# Patient Record
Sex: Female | Born: 1990 | Hispanic: Yes | Marital: Married | Smoking: Never smoker
Health system: Southern US, Community
[De-identification: ages and names within clinical notes are randomized; demographics above are authoritative.]

## PROBLEM LIST (undated history)

## (undated) DIAGNOSIS — Z862 Personal history of diseases of the blood and blood-forming organs and certain disorders involving the immune mechanism: Secondary | ICD-10-CM

## (undated) DIAGNOSIS — Z9889 Other specified postprocedural states: Secondary | ICD-10-CM

## (undated) DIAGNOSIS — I10 Essential (primary) hypertension: Secondary | ICD-10-CM

## (undated) DIAGNOSIS — O139 Gestational [pregnancy-induced] hypertension without significant proteinuria, unspecified trimester: Secondary | ICD-10-CM

## (undated) DIAGNOSIS — D649 Anemia, unspecified: Secondary | ICD-10-CM

## (undated) DIAGNOSIS — Z8759 Personal history of other complications of pregnancy, childbirth and the puerperium: Secondary | ICD-10-CM

## (undated) DIAGNOSIS — F32A Depression, unspecified: Secondary | ICD-10-CM

## (undated) HISTORY — DX: Anemia, unspecified: D64.9

## (undated) HISTORY — DX: Other specified postprocedural states: Z98.890

## (undated) HISTORY — DX: Personal history of other complications of pregnancy, childbirth and the puerperium: Z87.59

## (undated) HISTORY — DX: Personal history of diseases of the blood and blood-forming organs and certain disorders involving the immune mechanism: Z86.2

## (undated) HISTORY — PX: OTHER SURGICAL HISTORY: SHX169

---
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
## 2012-09-02 ENCOUNTER — Ambulatory Visit: Payer: Self-pay | Admitting: Physician Assistant

## 2012-09-02 VITALS — BP 128/77 | HR 66 | Temp 99.3°F | Resp 16 | Ht 67.5 in | Wt 168.2 lb

## 2012-09-02 DIAGNOSIS — R51 Headache: Secondary | ICD-10-CM

## 2012-09-02 DIAGNOSIS — R8271 Bacteriuria: Secondary | ICD-10-CM

## 2012-09-02 DIAGNOSIS — R300 Dysuria: Secondary | ICD-10-CM

## 2012-09-02 DIAGNOSIS — IMO0002 Reserved for concepts with insufficient information to code with codable children: Secondary | ICD-10-CM

## 2012-09-02 DIAGNOSIS — R519 Headache, unspecified: Secondary | ICD-10-CM

## 2012-09-02 DIAGNOSIS — R82998 Other abnormal findings in urine: Secondary | ICD-10-CM

## 2012-09-02 DIAGNOSIS — R5381 Other malaise: Secondary | ICD-10-CM

## 2012-09-02 DIAGNOSIS — J309 Allergic rhinitis, unspecified: Secondary | ICD-10-CM

## 2012-09-02 DIAGNOSIS — R5383 Other fatigue: Secondary | ICD-10-CM

## 2012-09-02 LAB — POCT WET PREP WITH KOH
RBC Wet Prep HPF POC: NEGATIVE
WBC Wet Prep HPF POC: NEGATIVE

## 2012-09-02 LAB — POCT URINALYSIS DIPSTICK
Bilirubin, UA: NEGATIVE
Blood, UA: NEGATIVE
Glucose, UA: NEGATIVE
Ketones, UA: NEGATIVE
Nitrite, UA: NEGATIVE
Protein, UA: NEGATIVE
Spec Grav, UA: 1.02
Urobilinogen, UA: 0.2
pH, UA: 5.5

## 2012-09-02 LAB — POCT CBC
Hemoglobin: 14.4 g/dL (ref 12.2–16.2)
Lymph, poc: 1.8 (ref 0.6–3.4)
MCH, POC: 31.3 pg — AB (ref 27–31.2)
MCHC: 32.2 g/dL (ref 31.8–35.4)
MCV: 97.2 fL — AB (ref 80–97)
MID (cbc): 0.3 (ref 0–0.9)
MPV: 9.6 fL (ref 0–99.8)
POC MID %: 3.2 %M (ref 0–12)
Platelet Count, POC: 271 10*3/uL (ref 142–424)
WBC: 8.8 10*3/uL (ref 4.6–10.2)

## 2012-09-02 LAB — POCT UA - MICROSCOPIC ONLY
Casts, Ur, LPF, POC: NEGATIVE
Crystals, Ur, HPF, POC: NEGATIVE
Mucus, UA: POSITIVE
Yeast, UA: NEGATIVE

## 2012-09-02 MED ORDER — SULFAMETHOXAZOLE-TRIMETHOPRIM 800-160 MG PO TABS: 1.0000 | ORAL_TABLET | Freq: Two times a day (BID) | ORAL | Status: DC

## 2012-09-02 NOTE — Patient Instructions (Addendum)
Get plenty of rest and drink at least 64 ounces of water daily.  Use condoms EVERY SINGLE TIME. Sex should never hurt.  Ever.  No matter what.  If it does, stop and talk to your partner. Make sure you're ready and turned on before your partner penetrates your vagina.  Using a finger, or going more slowly can help.  Try using an over-the-counter allergy medication daily to reduce your allergy symptoms.  Claritin, Zyrtec and Allegra are excellent choices. If your symptoms are not better in 2-3 weeks, please return for additional evaluation.  Please make an appointment at the [facility] Department  [phone]) for a pap test, andSTI testing, as well as a flu vaccine.  They will cost less there than they do here.

## 2012-09-02 NOTE — Progress Notes (Signed)
Subjective:    Patient ID: Madison Hood, female    DOB: 05/02/1991, 22 y.o.   MRN: [id number]  HPI  This 21 y.o. female presents for evaluation of urinary burning, frequency and urgency x 1 week.  Symptoms began with an episode of vaginal pain during sex.  Had to stop intercourse because of the severity of pain.  Had no bleeding.  Pain resolved after a few minutes.  No history of painful sex previously, but on further questioning notes that vaginal penetration is always painful.  Inconsistent condom use.  Thinks, but isn't certain, that her partner is only intimate with her.  Also, complains of fatigue and HA for about a month.  Feels weak for the past week.  Loss of energy, like doesn't want to get out of bed.  Describes self as very active, working out daily, and feels energized after exercise, but has noticed decreased energy over the past month.  Mild HA .  Right frontal. Resolve with Advil.  Twice in the last month has had severe HA, associated with nausea/vomiting.  HA resolved with emesis both times. "I don't know if I'm depressed."  "I feel sad sometimes, but I don't have a reason to be sad."  Review of Systems As above.  Normal bowel movements and menses.  No myalgias, arthralgias or rash.  No dizziness, visual disturbance, paresthesias.  No suicidal/homicidal ideations. Denies allergy symptoms, chest pain, SOB.   History reviewed. No pertinent past medical history.  History reviewed. No pertinent past surgical history.  Prior to Admission medications   Not on File    No Known Allergies  History   Social History  . Marital Status: Single    Spouse Name: n/a    Number of Children: 0  . Years of Education: 12   Occupational History  . receptionist    Social History Main Topics  . Smoking status: Never Smoker   . Smokeless tobacco: Never Used  . Alcohol Use: 0.0 - 2.0 oz/week    0-4 drink(s) per week  . Drug Use: No  . Sexually Active: Yes -- Female partner(s)    Birth  Control/ Protection: Condom   Other Topics Concern  . Not on file   Social History Narrative   From [place], [place]; in the [place] since 2005.    Family History  Problem Relation Age of Onset  . Hypertension Mother   . Allergies Mother   . Migraines Sister   . Migraines Sister        Objective:   Physical Exam Blood pressure 128/77, pulse 66, temperature 99.3 F (37.4 C), temperature source Oral, resp. rate 16, height 5' 7.5" (1.715 m), weight 168 lb 3.2 oz (76.295 kg), last menstrual period 08/06/2012, SpO2 100.00%. Body mass index is 25.96 kg/(m^2). Well-developed, well nourished [place] female who is awake, alert and oriented, in NAD. HEENT: [place]/AT, PERRL, EOMI.  Sclera and conjunctiva are clear.  Fundi are normal bilaterally. EAC are patent, TMs are normal in appearance. Nasal mucosa is pink and moist. OP is clear. Dentition is in good repair. Neck: supple, non-tender, no lymphadenopathy, thyromegaly. Heart: RRR, no murmur Lungs: normal effort, CTA Abdomen: normo-active bowel sounds, supple, non-tender, no mass or organomegaly. Extremities: no cyanosis, clubbing or edema. Skin: warm and dry without rash. Psychologic: good mood and appropriate affect, normal speech and behavior. Gynecologic:  Normal female external genitalia.  Moderate white discharge at the introitus and within the vaginal vault.  Significant vaginismus. Cervix closed without discharge or friability.  No cervical motion tenderness, mild left adnexal tenderness.  Results for orders placed in visit on 09/02/12  POCT URINALYSIS DIPSTICK      Component Value Range   Color, UA yellow     Clarity, UA clear     Glucose, UA neg     Bilirubin, UA neg     Ketones, UA neg     Spec Grav, UA 1.020     Blood, UA neg     pH, UA 5.5     Protein, UA neg     Urobilinogen, UA 0.2     Nitrite, UA neg     Leukocytes, UA Trace    POCT UA - MICROSCOPIC ONLY      Component Value Range   WBC, Ur, HPF, POC 10-15     RBC,  urine, microscopic 1-3     Bacteria, U Microscopic 1+     Mucus, UA positive     Epithelial cells, urine per micros 0-4     Crystals, Ur, HPF, POC neg     Casts, Ur, LPF, POC neg     Yeast, UA neg    POCT CBC      Component Value Range   WBC 8.8  4.6 - 10.2 K/uL   Lymph, poc 1.8  0.6 - 3.4   POC LYMPH PERCENT 20.5  10 - 50 %L   MID (cbc) 0.3  0 - 0.9   POC MID % 3.2  0 - 12 %M   POC Granulocyte 6.7  2 - 6.9   Granulocyte percent 76.3  37 - 80 %G   RBC 4.60  4.04 - 5.48 M/uL   Hemoglobin 14.4  12.2 - 16.2 g/dL   HCT, POC 16.1  09.6 - 47.9 %   MCV 97.2 (*) 80 - 97 fL   MCH, POC 31.3 (*) 27 - 31.2 pg   MCHC 32.2  31.8 - 35.4 g/dL   RDW, POC 04.5     Platelet Count, POC 271  142 - 424 K/uL   MPV 9.6  0 - 99.8 fL  GLUCOSE, POCT (MANUAL RESULT ENTRY)      Component Value Range   POC Glucose 77  70 - 99 mg/dl  POCT WET PREP WITH KOH      Component Value Range   Trichomonas, UA Negative     Clue Cells Wet Prep HPF POC 1-4     Epithelial Wet Prep HPF POC 2-8     Yeast Wet Prep HPF POC negative     Bacteria Wet Prep HPF POC 1+     RBC Wet Prep HPF POC negative     WBC Wet Prep HPF POC negative     KOH Prep POC Negative    POCT URINE PREGNANCY      Component Value Range   Preg Test, Ur Negative           Assessment & Plan:   1. Dysuria  POCT urinalysis dipstick, POCT UA - Microscopic Only, POCT urine pregnancy  2. Bacteriuria with pyuria  Urine culture, sulfamethoxazole-trimethoprim (BACTRIM DS,SEPTRA DS) 800-160 MG per tablet  3. Dyspareunia  POCT Wet Prep with KOH  4. HA (headache)    5. Fatigue  POCT CBC, POCT glucose (manual entry)  6. AR (allergic rhinitis)     Patient Instructions  Get plenty of rest and drink at least 64 ounces of water daily.  Use condoms EVERY SINGLE TIME. Sex should never hurt.  Ever.  No matter  what.  If it does, stop and talk to your partner. Make sure you're ready and turned on before your partner penetrates your vagina.  Using a finger,  or going more slowly can help.  Try using an over-the-counter allergy medication daily to reduce your allergy symptoms.  Claritin, Zyrtec and Allegra are excellent choices. If your symptoms are not better in 2-3 weeks, please return for additional evaluation.  Please make an appointment at the [facility] Department  [phone]) for a pap test, andSTI testing, as well as a flu vaccine.  They will cost less there than they do here.

## 2012-09-05 LAB — URINE CULTURE

## 2015-05-05 ENCOUNTER — Encounter (HOSPITAL_COMMUNITY): Payer: Self-pay

## 2015-05-05 ENCOUNTER — Emergency Department (INDEPENDENT_AMBULATORY_CARE_PROVIDER_SITE_OTHER)
Admission: EM | Admit: 2015-05-05 | Discharge: 2015-05-05 | Disposition: A | Discharge status: Home / Self Care | Payer: Self-pay | Source: Home / Self Care | Attending: Family Medicine | Admitting: Family Medicine

## 2015-05-05 DIAGNOSIS — R238 Other skin changes: Secondary | ICD-10-CM

## 2015-05-05 DIAGNOSIS — R233 Spontaneous ecchymoses: Secondary | ICD-10-CM

## 2015-05-05 MED ORDER — HYDROXYZINE HCL 25 MG PO TABS: 25.0000 mg | ORAL_TABLET | Freq: Four times a day (QID) | ORAL | Status: DC

## 2015-05-05 NOTE — ED Provider Notes (Addendum)
CSN: [id number]     Arrival date & time 05/05/15  1935 History   First MD Initiated Contact with Patient 05/05/15 2046     Chief Complaint  Patient presents with  . Bleeding/Bruising   (Consider location/radiation/quality/duration/timing/severity/associated sxs/prior Treatment) Patient is a 24 y.o. female presenting with rash. The history is provided by the patient.  Rash Location:  Leg Leg rash location:  L leg and R leg Quality: bruising and itchiness   Severity:  Mild Onset quality:  Gradual Duration:  1 month Progression:  Unchanged Chronicity:  New Context: not medications   Ineffective treatments:  Anti-itch cream Associated symptoms: no fever   Associated symptoms comment:  No apparent bleeding from nose, gums, gi or gu systems. No fhx of bleeding issues, nl menses, is concerned about itching being contributory.   History reviewed. No pertinent past medical history. History reviewed. No pertinent past surgical history. Family History  Problem Relation Age of Onset  . Hypertension Mother   . Allergies Mother   . Migraines Sister   . Migraines Sister    History  Substance Use Topics  . Smoking status: Never Smoker   . Smokeless tobacco: Never Used  . Alcohol Use: 0.0 - 2.0 oz/week    0-4 drink(s) per week   OB History    Gravida Para Term Preterm AB TAB SAB Ectopic Multiple Living   1 0 0 0 1 1 0 0 0 0      Review of Systems  Constitutional: Negative.  Negative for fever.  HENT: Negative.   Musculoskeletal: Negative.   Skin: Positive for rash.    Allergies  Review of patient's allergies indicates no known allergies.  Home Medications   Prior to Admission medications   Medication Sig Start Date End Date Taking? Authorizing Provider  hydrOXYzine (ATARAX/VISTARIL) 25 MG tablet Take 1 tablet (25 mg total) by mouth every 6 (six) hours. Prn itching 05/05/15   Linna HoffJames D Dijon Kohlman, MD  sulfamethoxazole-trimethoprim (BACTRIM DS,SEPTRA DS) 800-160 MG per tablet Take 1  tablet by mouth 2 (two) times daily. 09/02/12   Chelle Jeffery, PA-C   BP 113/66 mmHg  Pulse 60  Temp(Src) 99.4 F (37.4 C) (Oral)  Resp 16  SpO2 99% Physical Exam  Constitutional: She is oriented to person, place, and time. She appears well-developed and well-nourished.  Eyes: Conjunctivae and EOM are normal. Pupils are equal, round, and reactive to light.  Neck: Normal range of motion. Neck supple.  Musculoskeletal: Normal range of motion.  Lymphadenopathy:    She has no cervical adenopathy.  Neurological: She is alert and oriented to person, place, and time.  Skin: Skin is warm and dry. Rash noted.  Mult nontender, bilat lower ext bruises,   Nursing note and vitals reviewed.   ED Course  Procedures (including critical care time) Labs Review Labs Reviewed - No data to display  Imaging Review No results found.   MDM   1. Abnormal bruising        Linna HoffJames D Parry Po, MD 05/05/15 2132  Linna HoffJames D Tawan Corkern, MD 05/05/15 2134

## 2015-05-05 NOTE — Discharge Instructions (Signed)
Use medicine for itching as needed, see specialist for further eval of bruising.

## 2015-05-05 NOTE — ED Notes (Signed)
States she bruises every time she scratches her skin x 1 month

## 2015-10-29 ENCOUNTER — Encounter (HOSPITAL_COMMUNITY): Payer: Self-pay | Admitting: *Deleted

## 2015-10-29 ENCOUNTER — Emergency Department (HOSPITAL_COMMUNITY)
Admission: EM | Admit: 2015-10-29 | Discharge: 2015-10-29 | Disposition: A | Discharge status: Home / Self Care | Payer: Self-pay | Attending: Emergency Medicine | Admitting: Emergency Medicine

## 2015-10-29 DIAGNOSIS — Z3A08 8 weeks gestation of pregnancy: Secondary | ICD-10-CM | POA: Insufficient documentation

## 2015-10-29 DIAGNOSIS — O210 Mild hyperemesis gravidarum: Secondary | ICD-10-CM | POA: Insufficient documentation

## 2015-10-29 DIAGNOSIS — Z79899 Other long term (current) drug therapy: Secondary | ICD-10-CM | POA: Insufficient documentation

## 2015-10-29 DIAGNOSIS — O200 Threatened abortion: Secondary | ICD-10-CM | POA: Insufficient documentation

## 2015-10-29 LAB — ABO/RH: ABO/RH(D): A POS

## 2015-10-29 LAB — CBC
HEMATOCRIT: 39.1 % (ref 36.0–46.0)
HEMOGLOBIN: 13.3 g/dL (ref 12.0–15.0)
MCH: 31.4 pg (ref 26.0–34.0)
MCHC: 34 g/dL (ref 30.0–36.0)
MCV: 92.2 fL (ref 78.0–100.0)
Platelets: 246 10*3/uL (ref 150–400)
RBC: 4.24 MIL/uL (ref 3.87–5.11)
RDW: 12.4 % (ref 11.5–15.5)
WBC: 9.9 10*3/uL (ref 4.0–10.5)

## 2015-10-29 LAB — HCG, QUANTITATIVE, PREGNANCY: hCG, Beta Chain, Quant, S: 212244 m[IU]/mL — ABNORMAL HIGH (ref <5)

## 2015-10-29 NOTE — ED Notes (Signed)
MD at bedside. 

## 2015-10-29 NOTE — ED Provider Notes (Signed)
CSN: [id number]     Arrival date & time 10/29/15  1547 History   First MD Initiated Contact with Patient 10/29/15 1816     Chief Complaint  Patient presents with  . Threatened Miscarriage     (Consider location/radiation/quality/duration/timing/severity/associated sxs/prior Treatment) HPI  Patient is a 24 year old G1 P0, 7w GA by LMP, who presents to the emergency department for vaginal bleeding. Reports small amount of dark vaginal bleeding that started this morning, less than her normal menstrual period, the amount of a small pad. Denies abdominal cramping, lightheadedness, chest pain, shortness of breath, or heart palpitations. Reports daily nausea and vomiting that has been ongoing since she found out she was pregnant. Denies prior history of blood clots, no family history of blood clots.  History reviewed. No pertinent past medical history. History reviewed. No pertinent past surgical history. Family History  Problem Relation Age of Onset  . Hypertension Mother   . Allergies Mother   . Migraines Sister   . Migraines Sister    Social History  Substance Use Topics  . Smoking status: Never Smoker   . Smokeless tobacco: Never Used  . Alcohol Use: 0.0 - 2.0 oz/week    0-4 drink(s) per week   OB History    Gravida Para Term Preterm AB TAB SAB Ectopic Multiple Living       Review of Systems  Constitutional: Negative for fever and appetite change.  HENT: Negative for congestion.   Respiratory: Negative for shortness of breath.   Cardiovascular: Negative for chest pain.  Gastrointestinal: Positive for nausea and vomiting. Negative for abdominal pain and diarrhea.  Genitourinary: Positive for vaginal bleeding. Negative for dysuria, urgency, hematuria, flank pain, decreased urine volume, vaginal pain and pelvic pain.  Musculoskeletal: Negative for back pain.  Skin: Negative for rash.  Neurological: Negative for seizures, syncope, weakness and  light-headedness.  Psychiatric/Behavioral: Negative for behavioral problems.      Allergies  Review of patient's allergies indicates no known allergies.  Home Medications   Prior to Admission medications   Medication Sig Start Date End Date Taking? Authorizing Provider  Prenatal Vit-Fe Fumarate-FA (PRENATAL MULTIVITAMIN) TABS tablet Take 1 tablet by mouth daily at 12 noon.   Yes Historical Provider, MD  promethazine (PHENERGAN) 25 MG tablet Take 12.5 mg by mouth every 6 (six) hours as needed for nausea or vomiting.   Yes Historical Provider, MD  hydrOXYzine (ATARAX/VISTARIL) 25 MG tablet Take 1 tablet (25 mg total) by mouth every 6 (six) hours. Prn itching 05/05/15   Linna Hoff, MD  sulfamethoxazole-trimethoprim (BACTRIM DS,SEPTRA DS) 800-160 MG per tablet Take 1 tablet by mouth 2 (two) times daily. 09/02/12   Chelle Jeffery, PA-C   BP 106/63 mmHg  Pulse 69  Temp(Src) 97.4 F (36.3 C) (Oral)  Resp 14  Ht  (1.702 m)  Wt 77.021 kg  BMI 26.59 kg/m2  SpO2 98%  LMP 09/02/2015 Physical Exam  Constitutional: She is oriented to person, place, and time. She appears well-developed and well-nourished.  HENT:  Head: Normocephalic and atraumatic.  Mouth/Throat: Oropharynx is clear and moist.  Eyes: Conjunctivae and EOM are normal. Pupils are equal, round, and reactive to light.  Neck: Normal range of motion. Neck supple.  Cardiovascular: Normal rate, regular rhythm, normal heart sounds and intact distal pulses.   Pulmonary/Chest: Effort normal and breath sounds normal. No respiratory distress.  Abdominal: Soft. She exhibits no distension. There is no tenderness. There is  no rebound and no guarding.  Genitourinary: There is no rash on the right labia. There is no rash on the left labia. Cervix exhibits no motion tenderness. There is bleeding in the vagina.  Cervical os closed, no acute bleeding from os.   Musculoskeletal: Normal range of motion.  Neurological: She is alert and  oriented to person, place, and time.  Skin: Skin is warm. No pallor.  Psychiatric: She has a normal mood and affect.  Nursing note and vitals reviewed.   ED Course  Procedures (including critical care time) Labs Review Labs Reviewed  HCG, QUANTITATIVE, PREGNANCY - Abnormal; Notable for the following:    hCG, Beta Chain, Mahalia LongestQuant, S [id number] (*)    All other components within normal limits  CBC  ABO/RH    Imaging Review No results found. I have personally reviewed and evaluated these images and lab results as part of my medical decision-making.   EKG Interpretation None      EMERGENCY DEPARTMENT US PREGNANCY "Study: Limited Ultrasound of the Pelvis"  INDICATIONS:Vaginal bleeding Multiple views of the uterus and pelvic cavity are obtained with a multi-frequency probe.  APPROACH:Transabdominal   PERFORMED BY: Myself  IMAGES ARCHIVED?: Yes  LIMITATIONS: Body habitus  PREGNANCY FREE FLUID: None  PREGNANCY UTERUS FINDINGS:Uterus normal size and Gestational sac noted ADNEXAL FINDINGS:Left ovary not seen and Right ovary not seen  PREGNANCY FINDINGS: Intrauterine gestational sac noted and Fetal heart activity seen  INTERPRETATION: Viable intrauterine pregnancy  GESTATIONAL AGE, ESTIMATE: 435w5d  FETAL HEART RATE: 166  COMMENT(Estimate of Gestational Age):  3135w5d     MDM   Final diagnoses:  Threatened miscarriage   Patient is a 24 year old G1 P0, 7w GA by LMP, who presents to the emergency department for vaginal bleeding. On arrival no acute distress, not ill appearing. Afebrile, hemodynamically stable. Exam as above, notable for a benign abdominal exam without tenderness to palpation, closed cervical os with this scant dark blood in the vaginal vault, no active bleeding from the cervical os.   Serum hCG quant 212,244. Hemoglobin 13.3. ABO/Rh A positive.   Bedside transabdominal ultrasound obtained, showed a viable intrauterine gestational sac with fetal heart  activity. Fetal heart rate 166. Estimated gestational age 9035w5d.   Patient with threatened miscarriage. Does not need RhoGAM for her vaginal bleeding. Discussed with patient that this is common in the first trimester that there is nothing that we can do if she were to have a miscarriage. Discussed that she should follow-up with obstetrics for normal prenatal care. Given strict return precautions to the emergency department for worsening bleeding or abdominal pain. Patient expressed understanding, no cautions are concerned, discharge.    Corena HerterShannon Teandra Harlan, MD 10/30/15 [id number]  Gwyneth SproutWhitney Plunkett, MD 10/31/15 [phone]

## 2015-10-29 NOTE — ED Notes (Signed)
Pt reports vaginal bleeding today about 30 mins ago, pt denies pain at this time, pt reports being [redacted] wks pregnant, denies dysuria, hematuria, & painful intercourse, A&O x4

## 2015-10-29 NOTE — Discharge Instructions (Signed)

## 2015-11-09 LAB — HM PAP SMEAR: HM Pap smear: NORMAL

## 2015-11-13 LAB — CYTOLOGY - PAP: PAP SMEAR: NEGATIVE

## 2015-11-14 LAB — OB RESULTS CONSOLE HGB/HCT, BLOOD
HEMATOCRIT: 39 %
Hemoglobin: 13 g/dL

## 2015-11-14 LAB — OB RESULTS CONSOLE GC/CHLAMYDIA
CHLAMYDIA, DNA PROBE: NEGATIVE
Chlamydia: NEGATIVE
GC PROBE AMP, GENITAL: NEGATIVE
Gonorrhea: NEGATIVE

## 2015-11-14 LAB — OB RESULTS CONSOLE HEPATITIS B SURFACE ANTIGEN
Hepatitis B Surface Ag: NEGATIVE
Hepatitis B Surface Ag: NEGATIVE

## 2015-11-14 LAB — OB RESULTS CONSOLE ABO/RH: RH Type: POSITIVE

## 2015-11-14 LAB — OB RESULTS CONSOLE RPR
RPR: NONREACTIVE
RPR: NONREACTIVE

## 2015-11-14 LAB — OB RESULTS CONSOLE ANTIBODY SCREEN: Antibody Screen: NEGATIVE

## 2015-11-14 LAB — OB RESULTS CONSOLE PLATELET COUNT: Platelets: 250 10*3/uL

## 2015-11-14 LAB — OB RESULTS CONSOLE GBS: STREP GROUP B AG: POSITIVE

## 2015-11-14 LAB — OB RESULTS CONSOLE HIV ANTIBODY (ROUTINE TESTING)
HIV: NONREACTIVE
HIV: NONREACTIVE

## 2015-11-14 LAB — OB RESULTS CONSOLE RUBELLA ANTIBODY, IGM: Rubella: IMMUNE

## 2015-12-03 NOTE — L&D Delivery Note (Signed)
Delivery Note Admitted w/ prom, induced w/ cytotec x 1, then labored on own. Pushed almost 4 hours, and at 6:34 PM a viable female was delivered via Vaginal, Spontaneous Delivery (Presentation: Left Occiput Anterior).  APGAR: 9, 9; weight: pending at time of note.  Infant placed directly on mom's abdomen for bonding/skin-to-skin. Delayed cord clamping, then cord clamped x 2, and cut by fob.     Placenta status: Intact, Spontaneous, after nearly 30mins, trailing membranes twisted out w/ ring forceps.  Cord: 3 vessels with the following complications: None.  Cord pH: n/a  Anesthesia: Epidural  Episiotomy:  none Lacerations:  2nd degree perineal Suture Repair: 3.0 vicryl Est. Blood Loss (mL):  250ml  Mom to postpartum.  Baby to Couplet care / Skin to Skin. Plans to breastfeed, undecided about contraception  Madison Hood, Madison Hood 06/08/2016, 7:35 PM

## 2016-05-09 LAB — OB RESULTS CONSOLE GBS: STREP GROUP B AG: POSITIVE

## 2016-05-31 ENCOUNTER — Telehealth: Payer: Self-pay | Admitting: *Deleted

## 2016-05-31 NOTE — Telephone Encounter (Signed)
Pt called to office with question about a Rx. Attempt to contact pt. Number in chart is no longer a working number.

## 2016-06-07 ENCOUNTER — Encounter: Payer: Self-pay | Admitting: Obstetrics

## 2016-06-07 ENCOUNTER — Inpatient Hospital Stay (HOSPITAL_COMMUNITY)
Admission: AD | Admit: 2016-06-07 | Discharge: 2016-06-10 | DRG: 775 | Disposition: A | Discharge status: Home / Self Care | Payer: Medicaid Other | Source: Ambulatory Visit | Attending: Obstetrics & Gynecology | Admitting: Obstetrics & Gynecology

## 2016-06-07 ENCOUNTER — Encounter (HOSPITAL_COMMUNITY): Payer: Self-pay | Admitting: *Deleted

## 2016-06-07 DIAGNOSIS — O9962 Diseases of the digestive system complicating childbirth: Secondary | ICD-10-CM | POA: Diagnosis present

## 2016-06-07 DIAGNOSIS — O4290 Premature rupture of membranes, unspecified as to length of time between rupture and onset of labor, unspecified weeks of gestation: Secondary | ICD-10-CM | POA: Diagnosis present

## 2016-06-07 DIAGNOSIS — O99824 Streptococcus B carrier state complicating childbirth: Secondary | ICD-10-CM | POA: Diagnosis present

## 2016-06-07 DIAGNOSIS — O4202 Full-term premature rupture of membranes, onset of labor within 24 hours of rupture: Secondary | ICD-10-CM | POA: Diagnosis present

## 2016-06-07 DIAGNOSIS — Z3A40 40 weeks gestation of pregnancy: Secondary | ICD-10-CM | POA: Diagnosis not present

## 2016-06-07 DIAGNOSIS — K219 Gastro-esophageal reflux disease without esophagitis: Secondary | ICD-10-CM | POA: Diagnosis present

## 2016-06-07 LAB — CBC
HEMATOCRIT: 31.8 % — AB (ref 36.0–46.0)
Hemoglobin: 10.4 g/dL — ABNORMAL LOW (ref 12.0–15.0)
MCH: 28.7 pg (ref 26.0–34.0)
MCHC: 32.7 g/dL (ref 30.0–36.0)
MCV: 87.6 fL (ref 78.0–100.0)
Platelets: 219 10*3/uL (ref 150–400)
RBC: 3.63 MIL/uL — ABNORMAL LOW (ref 3.87–5.11)
RDW: 13.8 % (ref 11.5–15.5)
WBC: 8.3 10*3/uL (ref 4.0–10.5)

## 2016-06-07 LAB — ABO/RH: ABO/RH(D): A POS

## 2016-06-07 LAB — TYPE AND SCREEN
ABO/RH(D): A POS
Antibody Screen: NEGATIVE

## 2016-06-07 LAB — AMNISURE RUPTURE OF MEMBRANE (ROM) NOT AT ARMC: Amnisure ROM: POSITIVE

## 2016-06-07 MED ORDER — PENICILLIN G POTASSIUM 5000000 UNITS IJ SOLR
5.0000 10*6.[IU] | Freq: Once | INTRAVENOUS | Status: AC
  Administered 2016-06-07: 5 10*6.[IU] via INTRAVENOUS
  Filled 2016-06-07: qty 5

## 2016-06-07 MED ORDER — OXYTOCIN BOLUS FROM INFUSION
500.0000 mL | INTRAVENOUS | Status: DC
Start: 2016-06-07 — End: 2016-06-08

## 2016-06-07 MED ORDER — LIDOCAINE HCL (PF) 1 % IJ SOLN
30.0000 mL | INTRAMUSCULAR | Status: DC | PRN
  Filled 2016-06-07: qty 30

## 2016-06-07 MED ORDER — FENTANYL CITRATE (PF) 100 MCG/2ML IJ SOLN
100.0000 ug | INTRAMUSCULAR | Status: DC | PRN
Start: 2016-06-07 — End: 2016-06-08
  Administered 2016-06-07 – 2016-06-08 (×2): 100 ug via INTRAVENOUS
  Filled 2016-06-07 (×2): qty 2

## 2016-06-07 MED ORDER — OXYCODONE-ACETAMINOPHEN 5-325 MG PO TABS: 1.0000 | ORAL_TABLET | ORAL | Status: DC | PRN

## 2016-06-07 MED ORDER — ACETAMINOPHEN 325 MG PO TABS: 650.0000 mg | ORAL_TABLET | ORAL | Status: DC | PRN

## 2016-06-07 MED ORDER — LACTATED RINGERS IV SOLN
INTRAVENOUS | Status: DC
  Administered 2016-06-07 – 2016-06-08 (×5): via INTRAVENOUS

## 2016-06-07 MED ORDER — OXYTOCIN 40 UNITS IN LACTATED RINGERS INFUSION - SIMPLE MED
2.5000 [IU]/h | INTRAVENOUS | Status: DC
  Filled 2016-06-07: qty 1000

## 2016-06-07 MED ORDER — ONDANSETRON HCL 4 MG/2ML IJ SOLN: 4.0000 mg | Freq: Four times a day (QID) | INTRAMUSCULAR | Status: DC | PRN

## 2016-06-07 MED ORDER — ZOLPIDEM TARTRATE 5 MG PO TABS: 5.0000 mg | ORAL_TABLET | Freq: Every evening | ORAL | Status: DC | PRN

## 2016-06-07 MED ORDER — SOD CITRATE-CITRIC ACID 500-334 MG/5ML PO SOLN: 30.0000 mL | ORAL | Status: DC | PRN

## 2016-06-07 MED ORDER — DEXTROSE 5 % IV SOLN
2.5000 10*6.[IU] | INTRAVENOUS | Status: DC
  Administered 2016-06-07 – 2016-06-08 (×6): 2.5 10*6.[IU] via INTRAVENOUS
  Filled 2016-06-07 (×9): qty 2.5

## 2016-06-07 MED ORDER — MISOPROSTOL 200 MCG PO TABS
50.0000 ug | ORAL_TABLET | ORAL | Status: DC
  Administered 2016-06-07 – 2016-06-08 (×2): 50 ug via ORAL
  Filled 2016-06-07 (×2): qty 0.5

## 2016-06-07 MED ORDER — LACTATED RINGERS IV SOLN
500.0000 mL | INTRAVENOUS | Status: DC | PRN
  Administered 2016-06-08: 500 mL via INTRAVENOUS

## 2016-06-07 MED ORDER — OXYCODONE-ACETAMINOPHEN 5-325 MG PO TABS: 2.0000 | ORAL_TABLET | ORAL | Status: DC | PRN

## 2016-06-07 MED ORDER — TERBUTALINE SULFATE 1 MG/ML IJ SOLN
0.2500 mg | Freq: Once | INTRAMUSCULAR | Status: DC | PRN
  Filled 2016-06-07: qty 1

## 2016-06-07 NOTE — H&P (Signed)
S:  Ms Ninetta LightsFabiola Toledo-Vazquez is a 25 y.o. female G1P0000 at 4964w4d here with possible ROM.  She states that at 5:30 this morning she felt a clear gush of fluid. + positive fm, with some occasional contractions. She started her care with Dr. Gaynell FaceMarshall.    O:  GENERAL: Well-developed, well-nourished female in no acute distress.  LUNGS: Effort normal SKIN: Warm, dry and without erythema PSYCH: Normal mood and affect  Filed Vitals:   06/07/16 0738  BP: 120/71  Pulse: 76  Temp: 97.5 F (36.4 C)  TempSrc: Oral  Resp: 16  Height: 5\' 7"  (1.702 m)  Weight: 195 lb (88.451 kg)   Results for orders placed or performed during the hospital encounter of 06/07/16 (from the past 48 hour(s))  [facility]rom[facility]     Status: None   Collection Time: 06/07/16  7:56 AM  Result Value Ref Range   Amnisure ROM POSITIVE      A:  PROM @ term gestation GBS positive Category 1 fetal tracing   P:   Admit to labor and delivery Wynelle BourgeoisMarie Williams CNM made aware   Duane LopeJennifer I Jantzen Pilger, NP 06/07/2016 8:41 AM

## 2016-06-07 NOTE — MAU Note (Signed)
Patient presents with ruptured membranes at 5:30 this morning clear fluid, positive fm, occasional contractions.

## 2016-06-07 NOTE — Progress Notes (Addendum)
Labor Progress Note Ninetta LightsFabiola Toledo-Vazquez is a 25 y.o. G1P0000 at 375w4d presented for PROM @0530  S: no concerns doing well.   O:  BP 110/68 mmHg  Pulse 70  Temp(Src) 98.2 F (36.8 C) (Oral)  Resp 16  Ht 5\' 7"  (1.702 m)  Wt 88.451 kg (195 lb)  BMI 30.53 kg/m2  LMP 09/02/2015 EFM: HR 150-155/mod variability/15x15 accels  CTX: none seen on toco  CVE: Dilation: Fingertip Effacement (%): Thick Cervical Position: Posterior Station: -3 Presentation: Vertex Exam by:: m williams via bedside ultrasound   A&P: 25 y.o. G1P0000 775w4d PROM @ 0530. GBS unknown.  #Labor: Cytotec PO q 4 hours for ripening #Pain: eventual epidural  #FWB: Cat 1  #GBS positive- PCN  Palma HolterKanishka G Linzey Ramser, MD 9:58 PM

## 2016-06-07 NOTE — MAU Note (Signed)
Water broke at 0530, that is when she felt 1st contraction.  Clear fluid, no bleeding.  1st preg. +GBS

## 2016-06-08 ENCOUNTER — Inpatient Hospital Stay (HOSPITAL_COMMUNITY): Payer: Medicaid Other | Admitting: Anesthesiology

## 2016-06-08 ENCOUNTER — Encounter (HOSPITAL_COMMUNITY): Payer: Self-pay | Admitting: Anesthesiology

## 2016-06-08 DIAGNOSIS — Z3A40 40 weeks gestation of pregnancy: Secondary | ICD-10-CM

## 2016-06-08 DIAGNOSIS — O99824 Streptococcus B carrier state complicating childbirth: Secondary | ICD-10-CM

## 2016-06-08 LAB — RPR: RPR: NONREACTIVE

## 2016-06-08 MED ORDER — FENTANYL 2.5 MCG/ML BUPIVACAINE 1/10 % EPIDURAL INFUSION (WH - ANES)
14.0000 mL/h | INTRAMUSCULAR | Status: DC | PRN
  Administered 2016-06-08: 14 mL/h via EPIDURAL
  Filled 2016-06-08: qty 125

## 2016-06-08 MED ORDER — DIPHENHYDRAMINE HCL 50 MG/ML IJ SOLN: 12.5000 mg | INTRAMUSCULAR | Status: DC | PRN

## 2016-06-08 MED ORDER — TERBUTALINE SULFATE 1 MG/ML IJ SOLN
0.2500 mg | Freq: Once | INTRAMUSCULAR | Status: DC | PRN
  Filled 2016-06-08: qty 1

## 2016-06-08 MED ORDER — BISACODYL 10 MG RE SUPP: 10.0000 mg | Freq: Every day | RECTAL | Status: DC | PRN

## 2016-06-08 MED ORDER — EPHEDRINE 5 MG/ML INJ
10.0000 mg | INTRAVENOUS | Status: DC | PRN
  Filled 2016-06-08: qty 2

## 2016-06-08 MED ORDER — MEASLES, MUMPS & RUBELLA VAC ~~LOC~~ INJ
0.5000 mL | INJECTION | Freq: Once | SUBCUTANEOUS | Status: DC
  Filled 2016-06-08: qty 0.5

## 2016-06-08 MED ORDER — PHENYLEPHRINE 40 MCG/ML (10ML) SYRINGE FOR IV PUSH (FOR BLOOD PRESSURE SUPPORT)
PREFILLED_SYRINGE | INTRAVENOUS | Status: AC
  Filled 2016-06-08: qty 20

## 2016-06-08 MED ORDER — SENNOSIDES-DOCUSATE SODIUM 8.6-50 MG PO TABS
2.0000 | ORAL_TABLET | ORAL | Status: DC
  Administered 2016-06-08: 2 via ORAL
  Filled 2016-06-08 (×2): qty 2

## 2016-06-08 MED ORDER — FENTANYL 2.5 MCG/ML BUPIVACAINE 1/10 % EPIDURAL INFUSION (WH - ANES)
INTRAMUSCULAR | Status: AC
  Administered 2016-06-08: 04:00:00
  Filled 2016-06-08: qty 125

## 2016-06-08 MED ORDER — COCONUT OIL OIL
1.0000 "application " | TOPICAL_OIL | Status: DC | PRN
  Filled 2016-06-08: qty 120

## 2016-06-08 MED ORDER — SIMETHICONE 80 MG PO CHEW: 80.0000 mg | CHEWABLE_TABLET | ORAL | Status: DC | PRN

## 2016-06-08 MED ORDER — PRENATAL MULTIVITAMIN CH
1.0000 | ORAL_TABLET | Freq: Every day | ORAL | Status: DC
  Administered 2016-06-09: 1 via ORAL
  Filled 2016-06-08: qty 1

## 2016-06-08 MED ORDER — DIPHENHYDRAMINE HCL 25 MG PO CAPS: 25.0000 mg | ORAL_CAPSULE | Freq: Four times a day (QID) | ORAL | Status: DC | PRN

## 2016-06-08 MED ORDER — ONDANSETRON HCL 4 MG PO TABS: 4.0000 mg | ORAL_TABLET | ORAL | Status: DC | PRN

## 2016-06-08 MED ORDER — PHENYLEPHRINE 40 MCG/ML (10ML) SYRINGE FOR IV PUSH (FOR BLOOD PRESSURE SUPPORT)
80.0000 ug | PREFILLED_SYRINGE | INTRAVENOUS | Status: DC | PRN
  Filled 2016-06-08: qty 5

## 2016-06-08 MED ORDER — TETANUS-DIPHTH-ACELL PERTUSSIS 5-2.5-18.5 LF-MCG/0.5 IM SUSP
0.5000 mL | Freq: Once | INTRAMUSCULAR | Status: AC
  Administered 2016-06-09: 0.5 mL via INTRAMUSCULAR

## 2016-06-08 MED ORDER — OXYTOCIN 40 UNITS IN LACTATED RINGERS INFUSION - SIMPLE MED
1.0000 m[IU]/min | INTRAVENOUS | Status: DC
  Administered 2016-06-08: 2 m[IU]/min via INTRAVENOUS

## 2016-06-08 MED ORDER — DIBUCAINE 1 % RE OINT: 1.0000 "application " | TOPICAL_OINTMENT | RECTAL | Status: DC | PRN

## 2016-06-08 MED ORDER — ONDANSETRON HCL 4 MG/2ML IJ SOLN: 4.0000 mg | INTRAMUSCULAR | Status: DC | PRN

## 2016-06-08 MED ORDER — ZOLPIDEM TARTRATE 5 MG PO TABS: 5.0000 mg | ORAL_TABLET | Freq: Every evening | ORAL | Status: DC | PRN

## 2016-06-08 MED ORDER — BENZOCAINE-MENTHOL 20-0.5 % EX AERO
1.0000 "application " | INHALATION_SPRAY | CUTANEOUS | Status: DC | PRN
  Administered 2016-06-09: 1 via TOPICAL
  Filled 2016-06-08: qty 56

## 2016-06-08 MED ORDER — SODIUM CHLORIDE 0.9 % IV SOLN: 250.0000 mL | INTRAVENOUS | Status: DC | PRN

## 2016-06-08 MED ORDER — SODIUM CHLORIDE 0.9% FLUSH: 3.0000 mL | INTRAVENOUS | Status: DC | PRN

## 2016-06-08 MED ORDER — WITCH HAZEL-GLYCERIN EX PADS: 1.0000 "application " | MEDICATED_PAD | CUTANEOUS | Status: DC | PRN

## 2016-06-08 MED ORDER — LACTATED RINGERS IV SOLN
500.0000 mL | Freq: Once | INTRAVENOUS | Status: AC
  Administered 2016-06-08: 500 mL via INTRAVENOUS

## 2016-06-08 MED ORDER — PHENYLEPHRINE 40 MCG/ML (10ML) SYRINGE FOR IV PUSH (FOR BLOOD PRESSURE SUPPORT)
80.0000 ug | PREFILLED_SYRINGE | INTRAVENOUS | Status: DC | PRN
  Filled 2016-06-08: qty 5
  Filled 2016-06-08: qty 10

## 2016-06-08 MED ORDER — LIDOCAINE HCL (PF) 1 % IJ SOLN
INTRAMUSCULAR | Status: DC | PRN
  Administered 2016-06-08 (×2): 4 mL via EPIDURAL

## 2016-06-08 MED ORDER — ACETAMINOPHEN 325 MG PO TABS
650.0000 mg | ORAL_TABLET | ORAL | Status: DC | PRN
  Administered 2016-06-09: 650 mg via ORAL
  Filled 2016-06-08: qty 2

## 2016-06-08 MED ORDER — SODIUM CHLORIDE 0.9% FLUSH: 3.0000 mL | Freq: Two times a day (BID) | INTRAVENOUS | Status: DC

## 2016-06-08 MED ORDER — FLEET ENEMA 7-19 GM/118ML RE ENEM: 1.0000 | ENEMA | Freq: Every day | RECTAL | Status: DC | PRN

## 2016-06-08 MED ORDER — IBUPROFEN 600 MG PO TABS
600.0000 mg | ORAL_TABLET | Freq: Four times a day (QID) | ORAL | Status: DC
  Administered 2016-06-08 – 2016-06-10 (×6): 600 mg via ORAL
  Filled 2016-06-08 (×6): qty 1

## 2016-06-08 NOTE — Anesthesia Procedure Notes (Signed)
Epidural Patient location during procedure: OB Start time: 06/08/2016 3:38 AM  Staffing Anesthesiologist: Mal AmabileFOSTER, Taylour Lietzke Performed by: anesthesiologist   Preanesthetic Checklist Completed: patient identified, site marked, surgical consent, pre-op evaluation, timeout performed, IV checked, risks and benefits discussed and monitors and equipment checked  Epidural Patient position: sitting Prep: site prepped and draped and DuraPrep Patient monitoring: continuous pulse ox and blood pressure Approach: midline Location: L3-L4 Injection technique: LOR air  Needle:  Needle type: Tuohy  Needle gauge: 17 G Needle length: 9 cm and 9 Needle insertion depth: 5 cm cm Catheter type: closed end flexible Catheter size: 19 Gauge Catheter at skin depth: 10 cm Test dose: negative and Other  Assessment Events: blood not aspirated, injection not painful, no injection resistance, negative IV test and no paresthesia  Additional Notes Patient identified. Risks and benefits discussed including failed block, incomplete  Pain control, post dural puncture headache, nerve damage, paralysis, blood pressure Changes, nausea, vomiting, reactions to medications-both toxic and allergic and post Partum back pain. All questions were answered. Patient expressed understanding and wished to proceed. Sterile technique was used throughout procedure. Epidural site was Dressed with sterile barrier dressing. No paresthesias, signs of intravascular injection Or signs of intrathecal spread were encountered.  Patient was more comfortable after the epidural was dosed. Please see RN's note for documentation of vital signs and FHR which are stable.

## 2016-06-08 NOTE — Anesthesia Preprocedure Evaluation (Signed)
Anesthesia Evaluation  Patient identified by MRN, date of birth, ID band Patient awake    Reviewed: Allergy & Precautions, Patient's Chart, lab work & pertinent test results  Airway Mallampati: III  TM Distance: >3 FB Neck ROM: Full    Dental no notable dental hx. (+) Teeth Intact   Pulmonary neg pulmonary ROS,    Pulmonary exam normal breath sounds clear to auscultation       Cardiovascular negative cardio ROS Normal cardiovascular exam Rhythm:Regular Rate:Normal     Neuro/Psych negative neurological ROS  negative psych ROS   GI/Hepatic negative GI ROS, Neg liver ROS, GERD  ,  Endo/Other  Obesity  Renal/GU negative Renal ROS  negative genitourinary   Musculoskeletal negative musculoskeletal ROS (+)   Abdominal (+) + obese,   Peds  Hematology  (+) anemia ,   Anesthesia Other Findings   Reproductive/Obstetrics (+) Pregnancy                             Anesthesia Physical Anesthesia Plan  ASA: II  Anesthesia Plan: Epidural   Post-op Pain Management:    Induction:   Airway Management Planned: Natural Airway  Additional Equipment:   Intra-op Plan:   Post-operative Plan:   Informed Consent: I have reviewed the patients History and Physical, chart, labs and discussed the procedure including the risks, benefits and alternatives for the proposed anesthesia with the patient or authorized representative who has indicated his/her understanding and acceptance.   Dental advisory given  Plan Discussed with: Anesthesiologist  Anesthesia Plan Comments:         Anesthesia Quick Evaluation

## 2016-06-08 NOTE — Progress Notes (Addendum)
OB Note  Category I tracing and approx q2-4552m UCs. Epidural in place and pitocin running. Patient currently pushing reasonably well. She pushes to +4 but is doing better now with tug of war technique. Continue with pushing. EFW 3500gm. Anticipate SVD. If progress stalls, repeat SVE and try and tell position. GBS pos and adequately treated. Prolonged ROM  Madison Hood, Jr MD Attending Center for [facility](Faculty Practice)

## 2016-06-08 NOTE — Anesthesia Pain Management Evaluation Note (Signed)
  CRNA Pain Management Visit Note  Patient: Madison Hood, 25 y.o., female  "Hello I am a member of the anesthesia team at [facility]. We have an anesthesia team available at all times to provide care throughout the hospital, including epidural management and anesthesia for C-section. I don't know your plan for the delivery whether it a natural birth, water birth, IV sedation, nitrous supplementation, doula or epidural, but we want to meet your pain goals."   1.Was your pain managed to your expectations on prior hospitalizations?   No prior hospitalizations  2.What is your expectation for pain management during this hospitalization?     Epidural  3.How can we help you reach that goal? Epidural in situ.  Record the patient's initial score and the patient's pain goal.   Pain: 3 --pressure, not pain.  Checked her epidural level with ice--adequate bilateral level around T7.  Patient did not want further interventions at this time and is satisfied.  Pain Goal: 0 [facility] wants you to be able to say your pain was always managed very well.  Veneda Kirksey L 06/08/2016

## 2016-06-08 NOTE — Progress Notes (Signed)
Patient ID: Madison Hood, female   DOB: [birth year], 25 y.o.   MRN: [id number] Madison Hood is a 25 y.o. G1P0000 at 3780w5d admitted for rupture of membranes Notified by rn, pt pushing x 1.5hrs and making slow change, but uc's spacing out- so gave order to begin pitocin. Not pushing right this minute, was tired and wanted to rest for a few minutes.   Subjective: Comfortable w/ epidural  Objective: BP 121/71 mmHg  Pulse 82  Temp(Src) 98.8 F (37.1 C) (Oral)  Resp 18  Ht 5\' 7"  (1.702 m)  Wt 88.451 kg (195 lb)  BMI 30.53 kg/m2  SpO2 99%  LMP 09/02/2015 Total I/O In: 180 [P.O.:180] Out: 1600 [Urine:1300; Emesis/NG output:300]  FHT:  FHR: 155 bpm, variability: moderate,  accelerations:  Present,  decelerations:  Present occ variable UC:   q 2-325mins  SVE:   Dilation: 10 Effacement (%): 80 Station: +1, +2 Exam by:: Marcelino DusterMichelle, RN   Pitocin @ 2 mu/min  Labs: Lab Results  Component Value Date   WBC 8.3 06/07/2016   HGB 10.4* 06/07/2016   HCT 31.8* 06/07/2016   MCV 87.6 06/07/2016   PLT 219 06/07/2016    Assessment / Plan: Spontaneous labor, progressing normally, pushing w/ slow progress, pitocin started d/t uc's spacing out  Labor: Progressing normally Fetal Wellbeing:  Category II Pain Control:  Epidural Pre-eclampsia: n/a I/D:  pcn for gbs+ Anticipated MOD:  NSVD  Marge DuncansBooker, Kimberly Randall CNM, WHNP-BC 06/08/2016, 5:09 PM

## 2016-06-08 NOTE — Progress Notes (Signed)
Madison Hood is a 66[name] y.o. G1P0000 at 308w5d admitted for rupture of membranes  Subjective: Pt uncomfortable with contractions, breathing with contractions and requesting pain medication.  Family in room for support.  Objective: BP 102/69 mmHg  Pulse 70  Temp(Src) 98.1 F (36.7 C) (Oral)  Resp 16  Ht 5\' 7"  (1.702 m)  Wt 88.451 kg (195 lb)  BMI 30.53 kg/m2  SpO2 100%  LMP 09/02/2015 I/O last 3 completed shifts: In: 375 [I.V.:125; IV Piggyback:250] Out: -     FHT:  FHR: 135 bpm, variability: moderate,  accelerations:  Present,  decelerations:  Absent UC:   regular, every 2-3 minutes SVE:   Dilation: 1 Effacement (%): 100 Station: -2 Exam by:: Madison Hood, CNM  Labs: Lab Results  Component Value Date   WBC 8.3 06/07/2016   HGB 10.4* 06/07/2016   HCT 31.8* 06/07/2016   MCV 87.6 06/07/2016   PLT 219 06/07/2016    Assessment / Plan: Augmentation of labor, progressing well  Labor: Cervix changed from FT to 1 cm but contraction pattern regular and moderate to palpation. Pt plans epidural so Ok to have epidural now as this is likely beginning of active labor.   Preeclampsia:  n/a Fetal Wellbeing:  Category I Pain Control:  IV pain meds I/D:  n/a Anticipated MOD:  NSVD  Hood, Madison Hood 06/08/2016, 5:01 AM

## 2016-06-09 DIAGNOSIS — O99824 Streptococcus B carrier state complicating childbirth: Secondary | ICD-10-CM

## 2016-06-09 DIAGNOSIS — O9962 Diseases of the digestive system complicating childbirth: Secondary | ICD-10-CM

## 2016-06-09 DIAGNOSIS — Z3A40 40 weeks gestation of pregnancy: Secondary | ICD-10-CM

## 2016-06-09 DIAGNOSIS — O4202 Full-term premature rupture of membranes, onset of labor within 24 hours of rupture: Secondary | ICD-10-CM

## 2016-06-09 NOTE — Anesthesia Postprocedure Evaluation (Signed)
Anesthesia Post Note  Patient: Madison Hood  Procedure(s) Performed: * No procedures listed *  Patient location during evaluation: Mother Baby Anesthesia Type: Epidural Level of consciousness: awake, awake and alert, oriented and patient cooperative Pain management: pain level controlled Vital Signs Assessment: post-procedure vital signs reviewed and stable Respiratory status: spontaneous breathing, nonlabored ventilation and respiratory function stable Cardiovascular status: stable Postop Assessment: no headache, no backache, patient able to bend at knees and no signs of nausea or vomiting Anesthetic complications: no     Last Vitals:  Filed Vitals:   06/08/16 2305 06/09/16 0330  BP: 110/69 97/52  Pulse: 96 71  Temp: 36.8 C 36.7 C  Resp: 18     Last Pain:  Filed Vitals:   06/09/16 0752  PainSc: 5    Pain Goal: Patients Stated Pain Goal: 3 (06/08/16 2301)               [name] L

## 2016-06-09 NOTE — Progress Notes (Signed)
Post Partum Day 1 Subjective: Eating, drinking, voiding, ambulating well.  +flatus.  Lochia and pain wnl.  Denies dizziness, lightheadedness, or sob. No complaints.   Objective: Blood pressure 97/52, pulse 71, temperature 98 F (36.7 C), temperature source Oral, resp. rate 18, height 5\' 7"  (1.702 m), weight 88.451 kg (195 lb), last menstrual period 09/02/2015, SpO2 99 %, unknown if currently breastfeeding.  Physical Exam:  General: alert, cooperative and no distress Lochia: appropriate Uterine Fundus: firm Incision: n/a DVT Evaluation: No evidence of DVT seen on physical exam. Negative Homan's sign. No cords or calf tenderness. No significant calf/ankle edema.   Recent Labs  06/07/16 0900  HGB 10.4*  HCT 31.8*    Assessment/Plan: Plan for discharge tomorrow, Breastfeeding, Lactation consult and Contraception POPs   LOS: 2 days   Madison Hood, Madison Hood 06/09/2016, 7:30 AM

## 2016-06-10 MED ORDER — NORETHINDRONE 0.35 MG PO TABS: 1.0000 | ORAL_TABLET | Freq: Every day | ORAL | Status: DC

## 2016-06-10 MED ORDER — IBUPROFEN 600 MG PO TABS: 600.0000 mg | ORAL_TABLET | Freq: Four times a day (QID) | ORAL | Status: DC

## 2016-06-10 NOTE — Discharge Summary (Signed)
        OB Discharge Summary  Patient Name: Madison Hood DOB: 1991/09/17 MRN: [id number]  Date of admission: 06/07/2016 Delivering MD: Shawna ClampBOOKER, KIMBERLY R   Date of discharge: 06/10/2016  Admitting diagnosis: PREG Intrauterine pregnancy: 3267w5d     Secondary diagnosis:Active Problems:   Premature rupture of membranes  Additional problems:none     Discharge diagnosis: Term Pregnancy Delivered                                                                     Post partum procedures:n/a  Augmentation: none  Complications: None  Hospital course:  Onset of Labor With Vaginal Delivery     25 y.o. yo G1P1001 at 9767w5d was admitted in Active Labor on 06/07/2016. Patient had an uncomplicated labor course as follows:  Membrane Rupture Time/Date: 5:30 AM ,06/07/2016   Intrapartum Procedures: Episiotomy: None [1]                                         Lacerations:  2nd degree [3]  Patient had a delivery of a Viable infant. 06/08/2016  Information for the patient's newborn:  Dwan Boltoledo-Vazquez, Girl Johnella MoloneyFabiola [[id number]][[id number]]  Delivery Method: Vaginal, Spontaneous Delivery (Filed from Delivery Summary)    Pateint had an uncomplicated postpartum course.  She is ambulating, tolerating a regular diet, passing flatus, and urinating well. Patient is discharged home in stable condition on 06/10/2016.    Physical exam  Filed Vitals:   06/08/16 2305 06/09/16 0330 06/09/16 1130 06/09/16 1752  BP: 110/69 97/52 102/66 100/55  Pulse: 96 71 66 58  Temp: 98.2 F (36.8 C) 98 F (36.7 C) 97.9 F (36.6 C) 97.7 F (36.5 C)  TempSrc: Oral  Oral Oral  Resp: 18  18 18   Height:      Weight:      SpO2: 99% 99%  100%   General: alert, cooperative and no distress Lochia: appropriate Uterine Fundus: firm Incision: N/A DVT Evaluation: No evidence of DVT seen on physical exam. Negative Homan's sign. No cords or calf tenderness. Labs: Lab Results  Component Value Date   WBC 8.3 06/07/2016   HGB 10.4*  06/07/2016   HCT 31.8* 06/07/2016   MCV 87.6 06/07/2016   PLT 219 06/07/2016   No flowsheet data found.  Discharge instruction: per After Visit Summary and "Baby and Me Booklet".  After Visit Meds:    Medication List    ASK your doctor about these medications        prenatal multivitamin Tabs tablet  Take 1 tablet by mouth daily at 12 noon.        Diet: routine diet  Activity: Advance as tolerated. Pelvic rest for 6 weeks.   Outpatient follow up:6 weeks Follow up Appt:No future appointments. Follow up visit: No Follow-up on file.  Postpartum contraception: Progesterone only pills  Newborn Data: Live born female  Birth Weight: 8 lb 8.5 oz (3870 g) APGAR: 9, 9  Baby Feeding: Breast Disposition:home with mother   06/10/2016 Wyvonnia DuskyMarie Lawson, CNM

## 2016-06-10 NOTE — Progress Notes (Signed)
UR chart review completed.  

## 2016-06-10 NOTE — Lactation Note (Signed)
This note was copied from a baby's chart. Lactation Consultation Note  Patient Name: Madison Hood ZOXWR'UToday's Date: 06/10/2016 Reason for consult: Initial assessment;Breast/nipple pain Initial visit made.  Breastfeeding consultation services and support information given and reviewed.  Mom c/o pain with every feeding.  Small abrasion noted on tips of both nipples. Mom has been feeding using cradle hold.  Breasts are filling.  Assisted with positioning baby in football hold on right breast.  Demonstrated to FOB and mom techniques for a deep latch including breast compression.  Baby opened wide and latched well with good depth.  Instructed on untucking bottom lip.  Baby nursed actively for 15 minutes with many swallow heard.  Baby came off breast content and relaxed.  I also instructed parents on the cross cradle hold.   Comfort gels given with instructions.  Discharge instructions given including engorgement treatment.  Lactation outpatient services and support reviewed and encouraged.  Maternal Data    Feeding Feeding Type: Breast Fed Length of feed: 15 min  LATCH Score/Interventions Latch: Grasps breast easily, tongue down, lips flanged, rhythmical sucking. Intervention(s): Teach feeding cues;Waking techniques Intervention(s): Adjust position;Assist with latch;Breast massage;Breast compression  Audible Swallowing: Spontaneous and intermittent Intervention(s): Hand expression;Alternate breast massage  Type of Nipple: Everted at rest and after stimulation  Comfort (Breast/Nipple): Filling, red/small blisters or bruises, mild/mod discomfort  Problem noted: Mild/Moderate discomfort Interventions (Mild/moderate discomfort): Comfort gels  Hold (Positioning): Assistance needed to correctly position infant at breast and maintain latch. Intervention(s): Breastfeeding basics reviewed;Support Pillows;Position options  LATCH Score: 8  Lactation Tools Discussed/Used     Consult  Status Consult Status: Complete    Huston FoleyMOULDEN, Vannary Greening S 06/10/2016, 10:38 AM

## 2016-07-03 ENCOUNTER — Ambulatory Visit (INDEPENDENT_AMBULATORY_CARE_PROVIDER_SITE_OTHER): Payer: Self-pay | Admitting: Obstetrics and Gynecology

## 2016-07-03 NOTE — Patient Instructions (Signed)
Contraception Choices Contraception (birth control) is the use of any methods or devices to prevent pregnancy. Below are some methods to help avoid pregnancy. HORMONAL METHODS   Contraceptive implant. This is a thin, plastic tube containing progesterone hormone. It does not contain estrogen hormone. Your health care provider inserts the tube in the inner part of the upper arm. The tube can remain in place for up to 3 years. After 3 years, the implant must be removed. The implant prevents the ovaries from releasing an egg (ovulation), thickens the cervical mucus to prevent sperm from entering the uterus, and thins the lining of the inside of the uterus.  Progesterone-only injections. These injections are given every 3 months by your health care provider to prevent pregnancy. This synthetic progesterone hormone stops the ovaries from releasing eggs. It also thickens cervical mucus and changes the uterine lining. This makes it harder for sperm to survive in the uterus.  Birth control pills. These pills contain estrogen and progesterone hormone. They work by preventing the ovaries from releasing eggs (ovulation). They also cause the cervical mucus to thicken, preventing the sperm from entering the uterus. Birth control pills are prescribed by a health care provider.Birth control pills can also be used to treat heavy periods.  Minipill. This type of birth control pill contains only the progesterone hormone. They are taken every day of each month and must be prescribed by your health care provider.  Birth control patch. The patch contains hormones similar to those in birth control pills. It must be changed once a week and is prescribed by a health care provider.  Vaginal ring. The ring contains hormones similar to those in birth control pills. It is left in the vagina for 3 weeks, removed for 1 week, and then a new one is put back in place. The patient must be comfortable inserting and removing the ring  from the vagina.A health care provider's prescription is necessary.  Emergency contraception. Emergency contraceptives prevent pregnancy after unprotected sexual intercourse. This pill can be taken right after sex or up to 5 days after unprotected sex. It is most effective the sooner you take the pills after having sexual intercourse. Most emergency contraceptive pills are available without a prescription. Check with your pharmacist. Do not use emergency contraception as your only form of birth control. BARRIER METHODS   Female condom. This is a thin sheath (latex or rubber) that is worn over the penis during sexual intercourse. It can be used with spermicide to increase effectiveness.  Female condom. This is a soft, loose-fitting sheath that is put into the vagina before sexual intercourse.  Diaphragm. This is a soft, latex, dome-shaped barrier that must be fitted by a health care provider. It is inserted into the vagina, along with a spermicidal jelly. It is inserted before intercourse. The diaphragm should be left in the vagina for 6 to 8 hours after intercourse.  Cervical cap. This is a round, soft, latex or plastic cup that fits over the cervix and must be fitted by a health care provider. The cap can be left in place for up to 48 hours after intercourse.  Sponge. This is a soft, circular piece of polyurethane foam. The sponge has spermicide in it. It is inserted into the vagina after wetting it and before sexual intercourse.  Spermicides. These are chemicals that kill or block sperm from entering the cervix and uterus. They come in the form of creams, jellies, suppositories, foam, or tablets. They do not require a   prescription. They are inserted into the vagina with an applicator before having sexual intercourse. The process must be repeated every time you have sexual intercourse. INTRAUTERINE CONTRACEPTION  Intrauterine device (IUD). This is a T-shaped device that is put in a woman's uterus  during a menstrual period to prevent pregnancy. There are 2 types:  Copper IUD. This type of IUD is wrapped in copper wire and is placed inside the uterus. Copper makes the uterus and fallopian tubes produce a fluid that kills sperm. It can stay in place for 10 years.  Hormone IUD. This type of IUD contains the hormone progestin (synthetic progesterone). The hormone thickens the cervical mucus and prevents sperm from entering the uterus, and it also thins the uterine lining to prevent implantation of a fertilized egg. The hormone can weaken or kill the sperm that get into the uterus. It can stay in place for 3-5 years, depending on which type of IUD is used. PERMANENT METHODS OF CONTRACEPTION  Female tubal ligation. This is when the woman's fallopian tubes are surgically sealed, tied, or blocked to prevent the egg from traveling to the uterus.  Hysteroscopic sterilization. This involves placing a small coil or insert into each fallopian tube. Your doctor uses a technique called hysteroscopy to do the procedure. The device causes scar tissue to form. This results in permanent blockage of the fallopian tubes, so the sperm cannot fertilize the egg. It takes about 3 months after the procedure for the tubes to become blocked. You must use another form of birth control for these 3 months.  Female sterilization. This is when the female has the tubes that carry sperm tied off (vasectomy).This blocks sperm from entering the vagina during sexual intercourse. After the procedure, the man can still ejaculate fluid (semen). NATURAL PLANNING METHODS  Natural family planning. This is not having sexual intercourse or using a barrier method (condom, diaphragm, cervical cap) on days the woman could become pregnant.  Calendar method. This is keeping track of the length of each menstrual cycle and identifying when you are fertile.  Ovulation method. This is avoiding sexual intercourse during ovulation.  Symptothermal  method. This is avoiding sexual intercourse during ovulation, using a thermometer and ovulation symptoms.  Post-ovulation method. This is timing sexual intercourse after you have ovulated. Regardless of which type or method of contraception you choose, it is important that you use condoms to protect against the transmission of sexually transmitted infections (STIs). Talk with your health care provider about which form of contraception is most appropriate for you.   This information is not intended to replace advice given to you by your health care provider. Make sure you discuss any questions you have with your health care provider.   Document Released: 11/18/2005 Document Revised: 11/23/2013 Document Reviewed: 05/13/2013 Elsevier Interactive Patient Education 2016 [employer]  

## 2016-07-03 NOTE — Progress Notes (Signed)
25 yo G1P1 s/p SVD 06/08/2016 presenting today for 2 week postpartum visit. Patient states she is feeling well and receiving ample support from FOB and her mother. She is breastfeeding but had to supplement with formula as she is not producing enough. She truly wants to breast feed solely. Patient reports her vaginal bleeding as decreasing in flow and without passage of clots. She denies HA, visual changes, RUQ/epigastric pain, nausea or emesis. She is not sexually active  No past medical history on file. No past surgical history on file. Family History  Problem Relation Age of Onset  . Hypertension Mother   . Allergies Mother   . Migraines Sister   . Migraines Sister    Social History  Substance Use Topics  . Smoking status: Never Smoker  . Smokeless tobacco: Never Used  . Alcohol use No   ROS See pertinent in HPI Blood pressure 101/69, pulse 68, temperature 98.3 F (36.8 C), temperature source Oral, weight 167 lb 12.8 oz (76.1 kg), last menstrual period 09/02/2015, unknown if currently breastfeeding.  GENERAL: Well-developed, well-nourished female in no acute distress.  BREASTS: Symmetric in size. No palpable masses or lymphadenopathy, skin changes, or nipple drainage. ABDOMEN: Soft, nontender, nondistended. No organomegaly. PELVIC: Not performed EXTREMITIES: No cyanosis, clubbing, or edema, 2+ distal pulses.  A/P 25 yo here for 2 week postpartum visit s/p uncomplicated SVD - Patient is doing well - Encouraged her to pump or breastfeed every 2-3 hours to increase milk supply. I also encouraged her to stay well hydrated - Continue prenatal vitamins - Patient is interested in LARC. IUD and Nexplanon discussed with the patient. She will inquire on cost and return for insertion at 6 week postpartum visit. Otherwise she will have it placed at the health department - RTC in 4 weeks

## 2016-07-19 ENCOUNTER — Encounter: Payer: Self-pay | Admitting: *Deleted

## 2016-07-19 DIAGNOSIS — Z3480 Encounter for supervision of other normal pregnancy, unspecified trimester: Secondary | ICD-10-CM

## 2016-07-19 DIAGNOSIS — Z3490 Encounter for supervision of normal pregnancy, unspecified, unspecified trimester: Secondary | ICD-10-CM | POA: Insufficient documentation

## 2016-07-31 ENCOUNTER — Encounter: Payer: Self-pay | Admitting: Obstetrics and Gynecology

## 2016-07-31 ENCOUNTER — Ambulatory Visit (INDEPENDENT_AMBULATORY_CARE_PROVIDER_SITE_OTHER): Payer: Self-pay | Admitting: Obstetrics and Gynecology

## 2016-07-31 NOTE — Progress Notes (Signed)
Subjective:     Madison Hood is a 25 y.o. female who presents for a postpartum visit. She is 6 weeks postpartum following a spontaneous vaginal delivery. I have fully reviewed the prenatal and intrapartum course. The delivery was at 40w5 gestational weeks. Outcome: spontaneous vaginal delivery. Anesthesia: epidural. Postpartum course has been uncomplicated. Baby's course has been uncomplicated. Baby is feeding by bottle - Similac Advance. Bleeding onset of menses on 8/24. Bowel function is normal. Bladder function is normal. Patient is sexually active. Contraception method is OCP (estrogen/progesterone). Postpartum depression screening: negative.     Review of Systems Pertinent items are noted in HPI.   Objective:    BP 96/62   Pulse 64   Temp 98.4 F (36.9 C) (Oral)   Wt 169 lb 12.8 oz (77 kg)   LMP 07/25/2016 (Exact Date)   Breastfeeding? No   BMI 26.59 kg/m   General:  alert, cooperative and no distress   Breasts:  inspection negative, no nipple discharge or bleeding, no masses or nodularity palpable  Lungs: clear to auscultation bilaterally  Heart:  regular rate and rhythm  Abdomen: soft, non-tender; bowel sounds normal; no masses,  no organomegaly   Vulva:  normal  Vagina: normal vagina, no discharge, exudate, lesion, or erythema  Cervix:  multiparous appearance  Corpus: normal size, contour, position, consistency, mobility, non-tender  Adnexa:  normal adnexa  Rectal Exam: Not performed.        Assessment:     Normal postpartum exam. Pap smear not done at today's visit.   Plan:    1. Contraception: OCP (estrogen/progesterone). Patient has an appointment on 9/5 with health department for IUD placement 2. Patient is medically cleared to resume all activities of daily living 3. Follow up in:  December for annual exam or as needed.

## 2017-01-20 DIAGNOSIS — F320 Major depressive disorder, single episode, mild: Secondary | ICD-10-CM | POA: Insufficient documentation

## 2017-10-06 ENCOUNTER — Ambulatory Visit (INDEPENDENT_AMBULATORY_CARE_PROVIDER_SITE_OTHER): Payer: BLUE CROSS/BLUE SHIELD | Admitting: Obstetrics and Gynecology

## 2017-10-06 ENCOUNTER — Encounter: Payer: Self-pay | Admitting: Obstetrics and Gynecology

## 2017-10-06 VITALS — BP 111/72 | HR 71 | Ht 67.0 in | Wt 171.0 lb

## 2017-10-06 DIAGNOSIS — Z01419 Encounter for gynecological examination (general) (routine) without abnormal findings: Secondary | ICD-10-CM

## 2017-10-06 DIAGNOSIS — Z124 Encounter for screening for malignant neoplasm of cervix: Secondary | ICD-10-CM

## 2017-10-06 DIAGNOSIS — Z1151 Encounter for screening for human papillomavirus (HPV): Secondary | ICD-10-CM

## 2017-10-06 DIAGNOSIS — N898 Other specified noninflammatory disorders of vagina: Secondary | ICD-10-CM | POA: Diagnosis not present

## 2017-10-06 NOTE — Progress Notes (Signed)
Pt presents for AEX c/o vaginal discharge - no odor, no itching. LLQ pelvic pain only during IC and c/o decreased libido.

## 2017-10-06 NOTE — Progress Notes (Signed)
Subjective:     Madison Hood is a 26 y.o. female G2P2 with BMI 26 who is here for a comprehensive physical exam. The patient reports the presence of a discharge and LLQ pain with intercourse. She is sexually active using Mirena IUD for contraception. She reports an occasional light period. She is without any other complaints  No past medical history on file. No past surgical history on file. Family History  Problem Relation Age of Onset  . Hypertension Mother   . Allergies Mother   . Migraines Sister   . Migraines Sister      Social History   Socioeconomic History  . Marital status: Single    Spouse name: n/a  . Number of children: 0  . Years of education: 8912  . Highest education level: Not on file  Social Needs  . Financial resource strain: Not on file  . Food insecurity - worry: Not on file  . Food insecurity - inability: Not on file  . Transportation needs - medical: Not on file  . Transportation needs - non-medical: Not on file  Occupational History  . Occupation: receptionist  Tobacco Use  . Smoking status: Never Smoker  . Smokeless tobacco: Never Used  Substance and Sexual Activity  . Alcohol use: No    Alcohol/week: 0.0 - 2.0 oz  . Drug use: No  . Sexual activity: Yes    Partners: Male  Other Topics Concern  . Not on file  Social History Narrative   [place]; in the [place] since 2005.   Health Maintenance  Topic Date Due  . INFLUENZA VACCINE  07/02/2017  . PAP SMEAR  11/08/2018  . TETANUS/TDAP  06/09/2026  . HIV Screening  Completed       Review of Systems Pertinent items are noted in HPI.   Objective:  Blood pressure 111/72, pulse 71, height 5\' 7"  (1.702 m), weight 171 lb (77.6 kg), last menstrual period 09/26/2017, not currently breastfeeding.     GENERAL: Well-developed, well-nourished female in no acute distress.  HEENT: Normocephalic, atraumatic. Sclerae anicteric.  NECK: Supple. Normal thyroid.  LUNGS: Clear to  auscultation bilaterally.  HEART: Regular rate and rhythm. BREASTS: Symmetric in size. No palpable masses or lymphadenopathy, skin changes, or nipple drainage. ABDOMEN: Soft, nontender, nondistended. No organomegaly. PELVIC: Normal external female genitalia. Vagina is pink and rugated.  Normal discharge. Normal appearing cervix with IUD strings visualized extending from the os. Uterus is normal in size. No adnexal mass or tenderness. EXTREMITIES: No cyanosis, clubbing, or edema, 2+ distal pulses.  Assessment:    Healthy female exam.      Plan:    Pap smear collected and wet prep collected. Patient declined STD screen as she reports they were negative with Health Department TSH ordered Patient will be contacted with abnormal results RTC in 1 year or prn See After Visit Summary for Counseling Recommendations

## 2017-10-07 LAB — TSH: TSH: 1.67 u[IU]/mL (ref 0.450–4.500)

## 2017-10-07 LAB — CBC
HEMATOCRIT: 39.5 % (ref 34.0–46.6)
HEMOGLOBIN: 13.3 g/dL (ref 11.1–15.9)
MCH: 30.9 pg (ref 26.6–33.0)
MCHC: 33.7 g/dL (ref 31.5–35.7)
MCV: 92 fL (ref 79–97)
Platelets: 261 10*3/uL (ref 150–379)
RBC: 4.31 x10E6/uL (ref 3.77–5.28)
RDW: 12.8 % (ref 12.3–15.4)
WBC: 7.4 10*3/uL (ref 3.4–10.8)

## 2017-10-08 LAB — CYTOLOGY - PAP: Diagnosis: NEGATIVE

## 2017-10-08 LAB — CERVICOVAGINAL ANCILLARY ONLY
Bacterial vaginitis: POSITIVE — AB
Candida vaginitis: NEGATIVE

## 2017-10-09 ENCOUNTER — Other Ambulatory Visit: Payer: Self-pay | Admitting: Obstetrics and Gynecology

## 2017-10-09 MED ORDER — METRONIDAZOLE 500 MG PO TABS: 500.0000 mg | ORAL_TABLET | Freq: Two times a day (BID) | ORAL | 0 refills | Status: DC

## 2017-10-16 ENCOUNTER — Encounter: Payer: Self-pay | Admitting: Obstetrics and Gynecology

## 2019-07-22 DIAGNOSIS — F320 Major depressive disorder, single episode, mild: Secondary | ICD-10-CM

## 2019-07-23 ENCOUNTER — Ambulatory Visit: Payer: Self-pay

## 2019-08-10 ENCOUNTER — Ambulatory Visit: Payer: BC Managed Care – PPO | Admitting: Obstetrics and Gynecology

## 2019-08-10 DIAGNOSIS — Z01419 Encounter for gynecological examination (general) (routine) without abnormal findings: Secondary | ICD-10-CM

## 2020-09-06 ENCOUNTER — Encounter: Payer: Self-pay | Admitting: Advanced Practice Midwife

## 2020-09-06 ENCOUNTER — Other Ambulatory Visit: Payer: Self-pay

## 2020-09-06 ENCOUNTER — Ambulatory Visit (LOCAL_COMMUNITY_HEALTH_CENTER): Payer: Self-pay | Admitting: Advanced Practice Midwife

## 2020-09-06 ENCOUNTER — Ambulatory Visit: Payer: Self-pay

## 2020-09-06 VITALS — BP 95/68 | Ht 67.0 in | Wt 189.0 lb

## 2020-09-06 DIAGNOSIS — Z3009 Encounter for other general counseling and advice on contraception: Secondary | ICD-10-CM

## 2020-09-06 DIAGNOSIS — F419 Anxiety disorder, unspecified: Secondary | ICD-10-CM | POA: Insufficient documentation

## 2020-09-06 DIAGNOSIS — E663 Overweight: Secondary | ICD-10-CM

## 2020-09-06 DIAGNOSIS — Z30431 Encounter for routine checking of intrauterine contraceptive device: Secondary | ICD-10-CM

## 2020-09-06 LAB — WET PREP FOR TRICH, YEAST, CLUE
Trichomonas Exam: NEGATIVE
Yeast Exam: NEGATIVE

## 2020-09-06 NOTE — Progress Notes (Signed)
Per client, name of Madison Hood that is in [place] is her (previously lived at [place] address in [place] and birthday is correct). Wet mount reviewed by Hazle Coca CNM and no treatment indicated per Ms. Sciora CNM. Jossie Ng, RN

## 2020-09-06 NOTE — Progress Notes (Addendum)
[facility] DEPARTMENT [facility] [address] Main Number: [phone]    Family Planning Visit- Initial Visit  Subjective:  Madison Hood is a 29 y.o. seperated HF G49P1021(4 yo daughter) nonsmoker being seen today for an initial well woman visit and to discuss family planning options.  She is currently using IUD Mirena for pregnancy prevention. Patient reports she does want a pregnancy in the next year.  Patient has the following medical conditions has Major depressive disorder, single episode, mild (HCC) and Overweight BMI=29.6 on their problem list.  Chief Complaint  Patient presents with  . Annual Exam    Patient reports wants to conceive soon.  LMP 08/28/20.  Last sex 09/03/20 without condom; with current partner x 5 years; 1 sex partner in last 3 mo. Unemployed, not in school, living with her 25 yo daughter. Last MJ 07/2020.  Last ETOH 09/04/20 (2 glasses wine)1x/mo.  Mirena inserted 08/2016.  Last pap 10/06/17 neg.  States had pp depression and had therapy.  Patient denies cigs  Body mass index is 29.6 kg/m. - Patient is eligible for diabetes screening based on BMI and age >32?  not applicable HA1C ordered? not applicable  Patient reports 1 of partners in last year. Desires STI screening?  Yes  Has patient been screened once for HCV in the past?  No  No results found for: HCVAB  Does the patient have current drug use (including MJ), have a partner with drug use, and/or has been incarcerated since last result? No  If yes-- Screen for HCV through [facility]   Does the patient meet criteria for HBV testing? No  Criteria:  -Household, sexual or needle sharing contact with HBV -History of drug use -HIV positive -Those with known Hep C   Health Maintenance Due  Topic Date Due  . Hepatitis C Screening  Never done  . INFLUENZA VACCINE  07/02/2020  . PAP SMEAR-Modifier  10/06/2020    Review of Systems  All other systems  reviewed and are negative.   The following portions of the patient's history were reviewed and updated as appropriate: allergies, current medications, past family history, past medical history, past social history, past surgical history and problem list. Problem list updated.   See flowsheet for other program required questions.  Objective:   Vitals:   09/06/20 1028  BP: 95/68  Weight: 189 lb (85.7 kg)  Height: 5\' 7"  (1.702 m)    Physical Exam Constitutional:      Appearance: Normal appearance. She is normal weight.  HENT:     Head: Normocephalic and atraumatic.     Mouth/Throat:     Mouth: Mucous membranes are moist.  Eyes:     Conjunctiva/sclera: Conjunctivae normal.  Cardiovascular:     Rate and Rhythm: Normal rate and regular rhythm.  Pulmonary:     Effort: Pulmonary effort is normal.     Breath sounds: Normal breath sounds.  Chest:     Breasts:        Right: Normal.        Left: Normal.  Abdominal:     Palpations: Abdomen is soft.     Comments: Soft without masses or tenderness, fair tone  Genitourinary:    General: Normal vulva.     Exam position: Lithotomy position.     Vagina: Vaginal discharge (white creamy leukorrhea, ph>4.5) present.     Cervix: Friability (sl friable to pap) present.     Uterus: Normal.  Adnexa: Right adnexa normal and left adnexa normal.     Rectum: Normal.  Musculoskeletal:        General: Normal range of motion.     Cervical back: Normal range of motion and neck supple.  Skin:    General: Skin is warm and dry.  Neurological:     Mental Status: She is alert.  Psychiatric:        Mood and Affect: Mood normal.       Assessment and Plan:  Madison Hood is a 29 y.o. female presenting to the [facility] Department for an initial well woman exam/family planning visit  Contraception counseling: Reviewed all forms of birth control options in the tiered based approach. available including abstinence; over the  counter/barrier methods; hormonal contraceptive medication including pill, patch, ring, injection,contraceptive implant, ECP; hormonal and nonhormonal IUDs; permanent sterilization options including vasectomy and the various tubal sterilization modalities. Risks, benefits, and typical effectiveness rates were reviewed.  Questions were answered.  Written information was also given to the patient to review.  Patient desires continuation of Mirena, this was prescribed for patient. She will follow up in 3 months for surveillance.  She was told to call with any further questions, or with any concerns about this method of contraception.  Emphasized use of condoms 100% of the time for STI prevention.  Patient was offered ECP. ECP was not accepted by the patient. ECP counseling was not given - see RN documentation  1. Overweight BMI=29.6  2. Family planning Treat wet mount per standing orders Immunization nurse consult - WET PREP FOR TRICH, YEAST, CLUE - Chlamydia/Gonorrhea [facility] - Gonococcus culture - IGP, Aptima HPV  3. Encounter for management of intrauterine contraceptive device (IUD), unspecified IUD management type Mirena inserted 08/06/16 Pt decides she want to take prenatal vits x 3 mo and then have Mirena removed to conceive in 3-4 mo Preconceptual counseling done     No follow-ups on file.  No future appointments.  Alberteen Spindle, CNM

## 2020-09-11 LAB — GONOCOCCUS CULTURE

## 2020-09-14 ENCOUNTER — Encounter: Payer: Self-pay | Admitting: Advanced Practice Midwife

## 2020-09-14 DIAGNOSIS — R87619 Unspecified abnormal cytological findings in specimens from cervix uteri: Secondary | ICD-10-CM | POA: Insufficient documentation

## 2020-09-14 LAB — IGP, APTIMA HPV
HPV Aptima: POSITIVE — AB
PAP Smear Comment: 0

## 2020-12-12 ENCOUNTER — Ambulatory Visit (LOCAL_COMMUNITY_HEALTH_CENTER): Payer: Self-pay | Admitting: Advanced Practice Midwife

## 2020-12-12 ENCOUNTER — Other Ambulatory Visit: Payer: Self-pay

## 2020-12-12 ENCOUNTER — Ambulatory Visit: Payer: Self-pay

## 2020-12-12 VITALS — BP 106/67 | HR 68 | Ht 67.0 in | Wt 189.6 lb

## 2020-12-12 DIAGNOSIS — Z30432 Encounter for removal of intrauterine contraceptive device: Secondary | ICD-10-CM

## 2020-12-12 NOTE — Progress Notes (Signed)
Contraception/Family Planning VISIT ENCOUNTER NOTE  [facility] DEPARTMENTContraception/Family Planning VISIT ENCOUNTER NOTE  Subjective:   Madison Hood is a 30 y.o.MHF nonsmoker G33P1021 female here for reproductive life counseling.  Desires Mirena removal to conceive.  Reports she does want a pregnancy in the next year. Denies abnormal vaginal bleeding, discharge, pelvic pain, problems with intercourse or other gynecologic concerns. Mirena inserted 08/06/16 and wants removal today to conceive.  LMP 11/27/20.    Gynecologic History No LMP recorded. (Menstrual status: IUD). Contraception: none  Health Maintenance Due  Topic Date Due  . Hepatitis C Screening  Never done  . INFLUENZA VACCINE  07/02/2020     The following portions of the patient's history were reviewed and updated as appropriate: allergies, current medications, past family history, past medical history, past social history, past surgical history and problem list.  Review of Systems Pertinent items are noted in HPI.   Objective:  BP 106/67   Pulse 68   Ht 5\' 7"  (1.702 m)   Wt 189 lb 9.6 oz (86 kg)   BMI 29.70 kg/m  Gen: well appearing, NAD HEENT: no scleral icterus CV: RR Lung: Normal WOB Ext: warm well perfused  PELVIC: Normal appearing external genitalia; normal appearing vaginal mucosa and cervix.  No abnormal discharge noted.  Pap smear obtained 10/06/17.  Normal uterine size, no other palpable masses, no uterine or adnexal tenderness.   Assessment and Plan:   Contraception counseling: Reviewed all forms of birth control options in the tiered based approach. available including abstinence; over the counter/barrier methods; hormonal contraceptive medication including pill, patch, ring, injection,contraceptive implant, ECP; hormonal and nonhormonal IUDs; permanent sterilization options including vasectomy and the various tubal sterilization modalities. Risks, benefits, and typical effectiveness  rates were reviewed.  Questions were answered.  Written information was also given to the patient to review.  Patient desires nothing, this was prescribed for patient. She will follow up in prn for surveillance.  She was told to call with any further questions, or with any concerns about this method of contraception.  Emphasized use of condoms 100% of the time for STI prevention.  Patient was offered ECP. ECP was not accepted by the patient. ECP counseling was not given - see RN documentation  1. Encounter for IUD removal   IUD Removal  Patient identified, informed consent performed, consent signed.  Patient was in the dorsal lithotomy position, normal external genitalia was noted.  A speculum was placed in the patient's vagina, normal discharge was noted, no lesions. The cervix was visualized, no lesions, no abnormal discharge.  The strings of the IUD were grasped and pulled using ring forceps. The IUD was removed in its entirety.   Patient tolerated the procedure well.    Patient plans for pregnancy soon and she was told to avoid teratogens, take PNV and folic acid.  Routine preventative health maintenance measures emphasized.     Please refer to After Visit Summary for other counseling recommendations.   Return if symptoms worsen or fail to improve.  13/5/18, CNM [facility] DEPARTMENT

## 2021-05-23 ENCOUNTER — Inpatient Hospital Stay (HOSPITAL_COMMUNITY): Payer: Self-pay

## 2021-05-23 ENCOUNTER — Encounter (HOSPITAL_COMMUNITY): Payer: Self-pay | Admitting: Obstetrics & Gynecology

## 2021-05-23 ENCOUNTER — Inpatient Hospital Stay (HOSPITAL_COMMUNITY)
Admission: AD | Admit: 2021-05-23 | Discharge: 2021-05-23 | Disposition: A | Discharge status: Home / Self Care | Payer: Self-pay | Attending: Obstetrics & Gynecology | Admitting: Obstetrics & Gynecology

## 2021-05-23 ENCOUNTER — Other Ambulatory Visit: Payer: Self-pay

## 2021-05-23 DIAGNOSIS — O209 Hemorrhage in early pregnancy, unspecified: Secondary | ICD-10-CM | POA: Insufficient documentation

## 2021-05-23 DIAGNOSIS — R109 Unspecified abdominal pain: Secondary | ICD-10-CM | POA: Insufficient documentation

## 2021-05-23 DIAGNOSIS — O26891 Other specified pregnancy related conditions, first trimester: Secondary | ICD-10-CM | POA: Insufficient documentation

## 2021-05-23 DIAGNOSIS — Z3A08 8 weeks gestation of pregnancy: Secondary | ICD-10-CM | POA: Insufficient documentation

## 2021-05-23 DIAGNOSIS — O4690 Antepartum hemorrhage, unspecified, unspecified trimester: Secondary | ICD-10-CM

## 2021-05-23 DIAGNOSIS — Z3491 Encounter for supervision of normal pregnancy, unspecified, first trimester: Secondary | ICD-10-CM

## 2021-05-23 LAB — COMPREHENSIVE METABOLIC PANEL
ALT: 13 U/L (ref 0–44)
AST: 15 U/L (ref 15–41)
Albumin: 3.5 g/dL (ref 3.5–5.0)
Alkaline Phosphatase: 45 U/L (ref 38–126)
Anion gap: 5 (ref 5–15)
BUN: 7 mg/dL (ref 6–20)
CO2: 24 mmol/L (ref 22–32)
Calcium: 9.1 mg/dL (ref 8.9–10.3)
Chloride: 106 mmol/L (ref 98–111)
Creatinine, Ser: 0.6 mg/dL (ref 0.44–1.00)
GFR, Estimated: >60 mL/min (ref >60)
Glucose, Bld: 84 mg/dL (ref 70–99)
Potassium: 4.1 mmol/L (ref 3.5–5.1)
Sodium: 135 mmol/L (ref 135–145)
Total Bilirubin: 0.2 mg/dL — ABNORMAL LOW (ref 0.3–1.2)
Total Protein: 6.1 g/dL — ABNORMAL LOW (ref 6.5–8.1)

## 2021-05-23 LAB — WET PREP, GENITAL
Clue Cells Wet Prep HPF POC: NONE SEEN
Sperm: NONE SEEN
Trich, Wet Prep: NONE SEEN
Yeast Wet Prep HPF POC: NONE SEEN

## 2021-05-23 LAB — CBC WITH DIFFERENTIAL/PLATELET
Abs Immature Granulocytes: 0.02 10*3/uL (ref 0.00–0.07)
Basophils Absolute: 0 10*3/uL (ref 0.0–0.1)
Basophils Relative: 0 %
Eosinophils Absolute: 0.1 10*3/uL (ref 0.0–0.5)
Eosinophils Relative: 1 %
HCT: 37.1 % (ref 36.0–46.0)
Hemoglobin: 12.6 g/dL (ref 12.0–15.0)
Immature Granulocytes: 0 %
Lymphocytes Relative: 18 %
Lymphs Abs: 1.4 10*3/uL (ref 0.7–4.0)
MCH: 31 pg (ref 26.0–34.0)
MCHC: 34 g/dL (ref 30.0–36.0)
MCV: 91.4 fL (ref 80.0–100.0)
Monocytes Absolute: 0.4 10*3/uL (ref 0.1–1.0)
Monocytes Relative: 5 %
Neutro Abs: 5.8 10*3/uL (ref 1.7–7.7)
Neutrophils Relative %: 76 %
Platelets: 217 10*3/uL (ref 150–400)
RBC: 4.06 MIL/uL (ref 3.87–5.11)
RDW: 11.9 % (ref 11.5–15.5)
WBC: 7.7 10*3/uL (ref 4.0–10.5)
nRBC: 0 % (ref 0.0–0.2)

## 2021-05-23 LAB — HIV ANTIBODY (ROUTINE TESTING W REFLEX): HIV Screen 4th Generation wRfx: NONREACTIVE

## 2021-05-23 LAB — URINALYSIS, ROUTINE W REFLEX MICROSCOPIC
Bacteria, UA: NONE SEEN
Bilirubin Urine: NEGATIVE
Glucose, UA: NEGATIVE mg/dL
Ketones, ur: 5 mg/dL — AB
Leukocytes,Ua: NEGATIVE
Nitrite: NEGATIVE
Protein, ur: NEGATIVE mg/dL
Specific Gravity, Urine: 1.016 (ref 1.005–1.030)
pH: 9 — ABNORMAL HIGH (ref 5.0–8.0)

## 2021-05-23 LAB — POCT PREGNANCY, URINE: Preg Test, Ur: POSITIVE — AB

## 2021-05-23 LAB — HCG, QUANTITATIVE, PREGNANCY: hCG, Beta Chain, Quant, S: 503025 m[IU]/mL — ABNORMAL HIGH (ref <5)

## 2021-05-23 IMAGING — US US OB COMP LESS 14 WK
1 series · 15 of 28 positions shown · non-contrast
Comparison: None.

CLINICAL DATA: 30-year-old pregnant female with spotting. LMP:
[DATE] corresponding to an estimated gestational age of 7 weeks,
5 days.

EXAM:
OBSTETRIC <14 WK ULTRASOUND
TECHNIQUE: Transabdominal ultrasound was performed for evaluation of the
gestation as well as the maternal uterus and adnexal regions.

[Series 1: us ob comp less 14 wk · 15 of 35 slices shown]
[im 1/35]
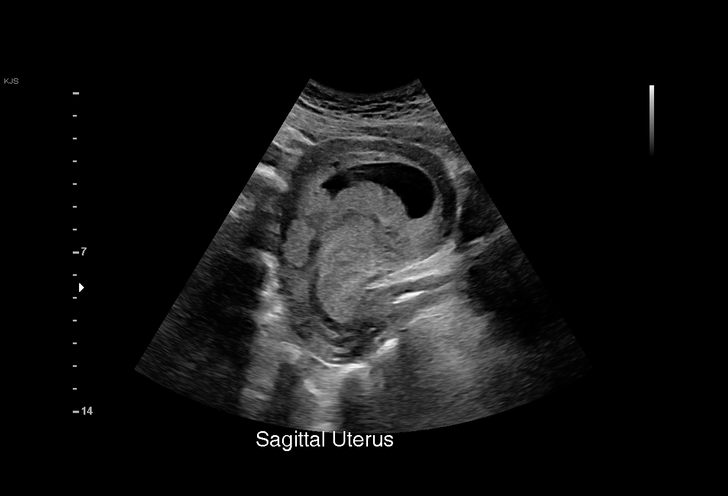
[im 3/35]
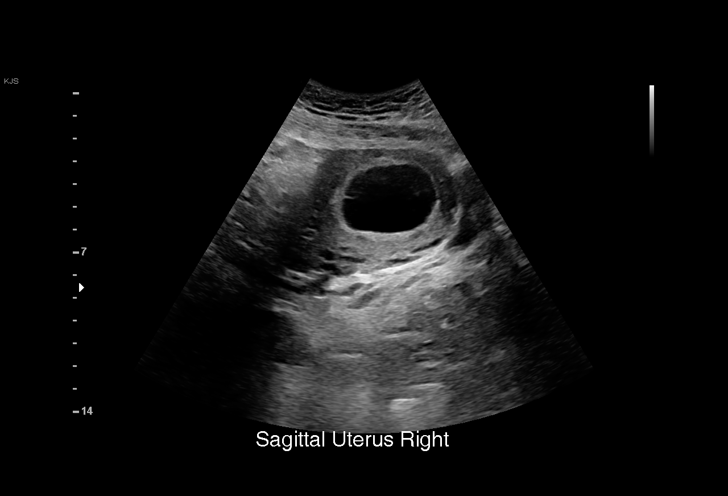
[im 6/35]
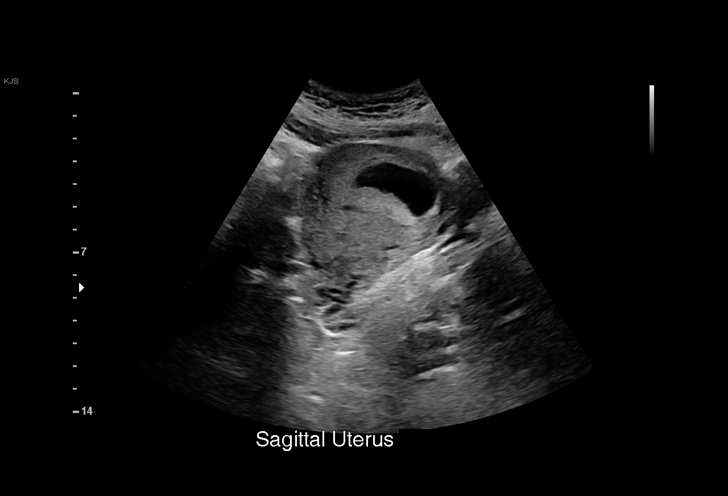
[im 8/35]
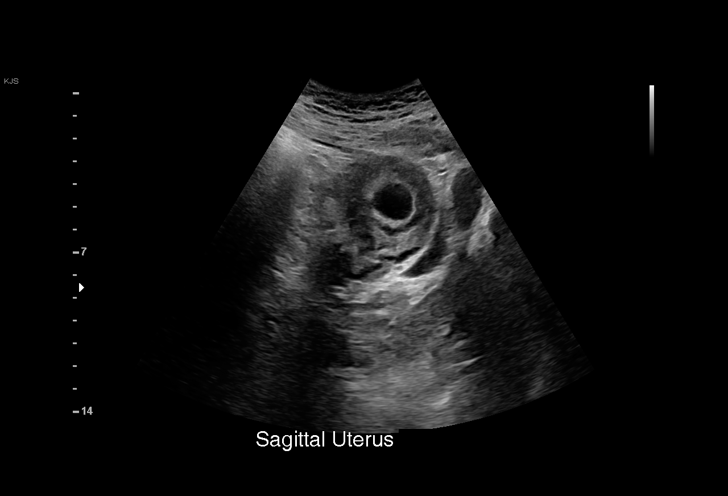
[im 11/35]
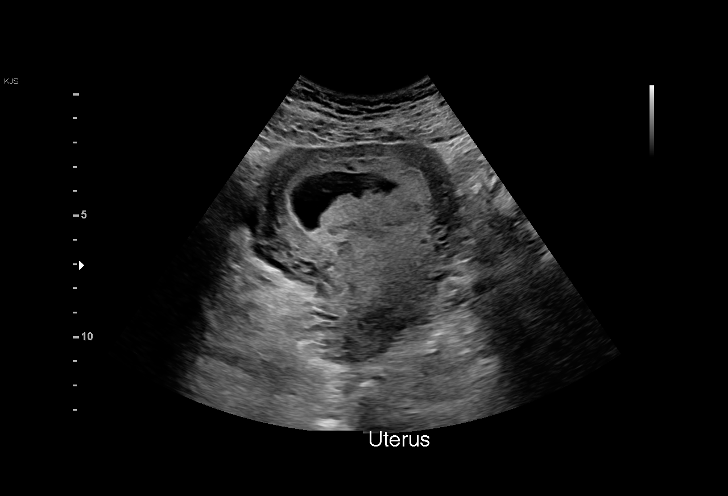
[im 13/35]
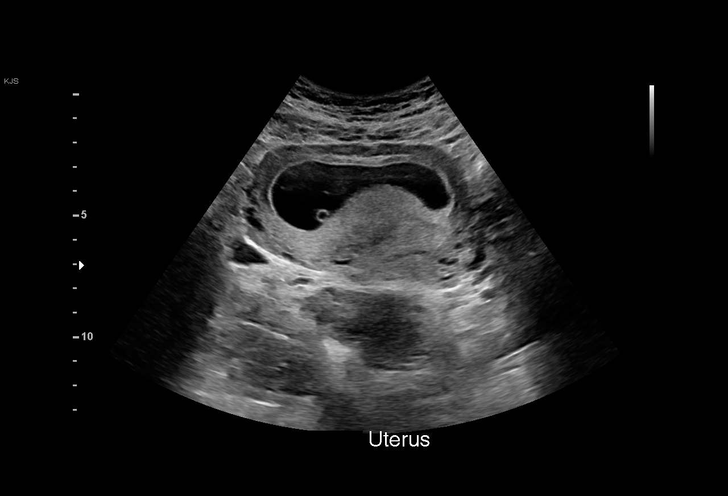
[im 16/35]
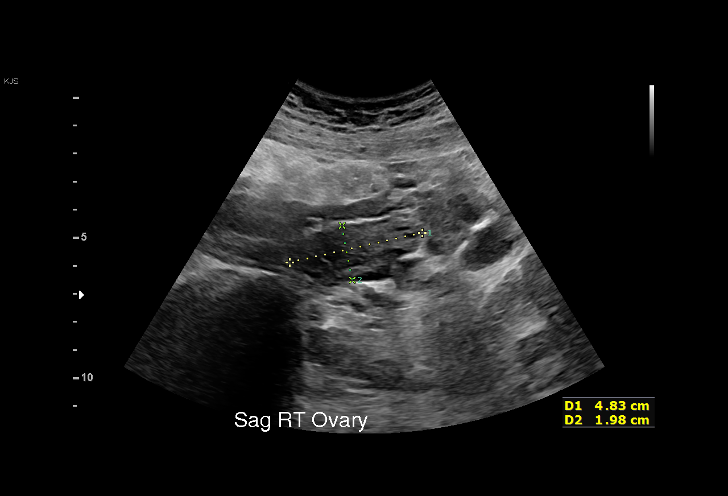
[im 18/35]
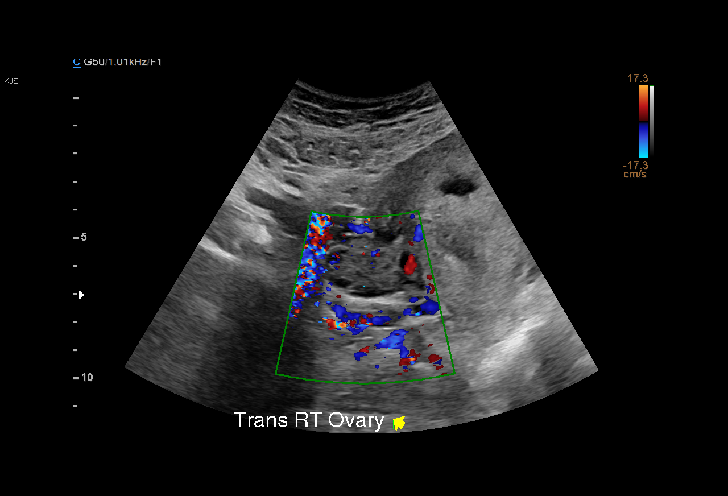
[im 19/35]
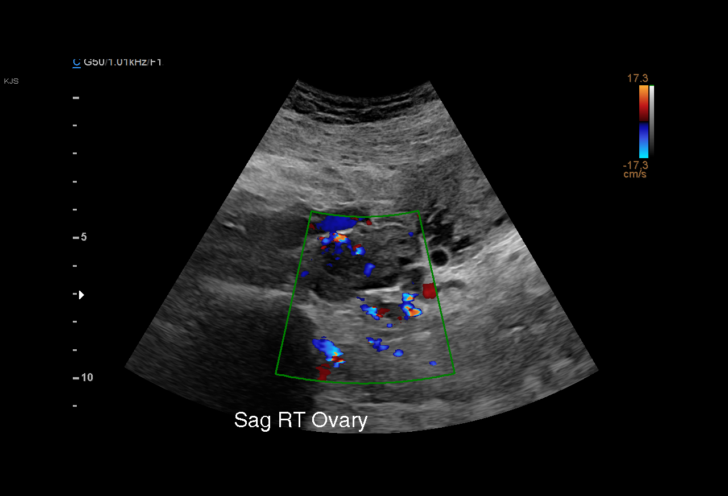
[im 22/35]
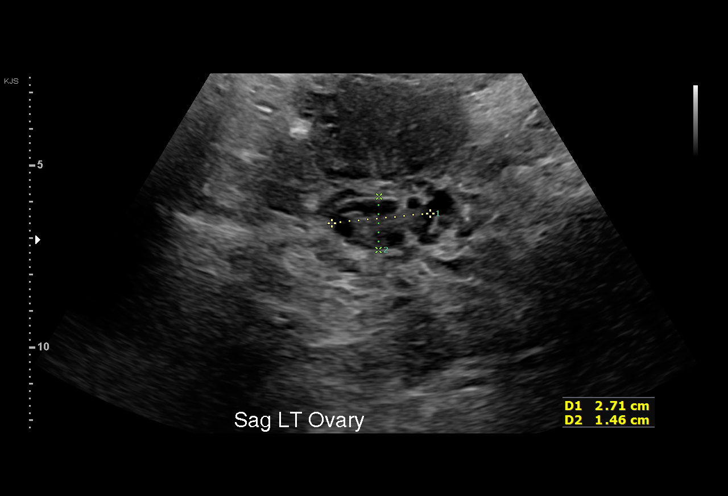
[im 24/35]
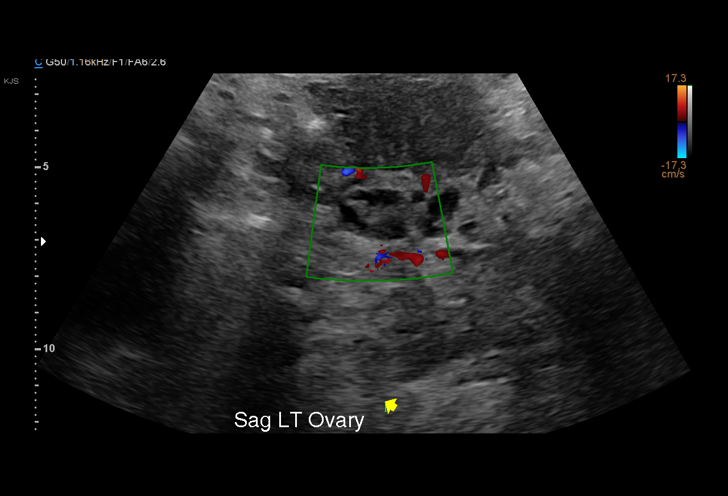
[im 27/35]
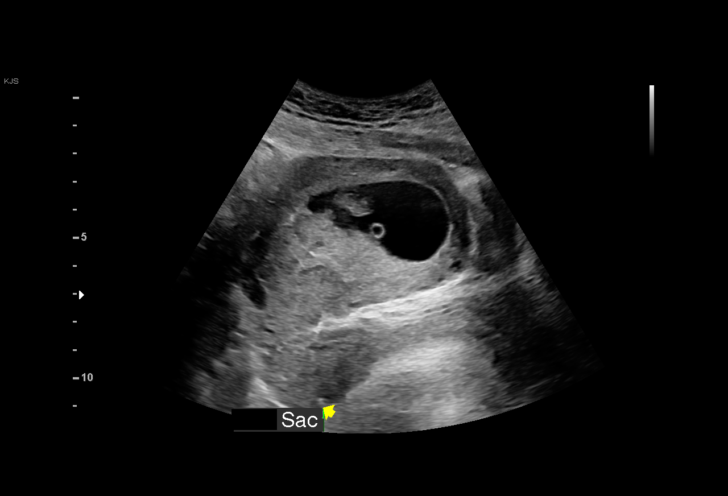
[im 29/35]
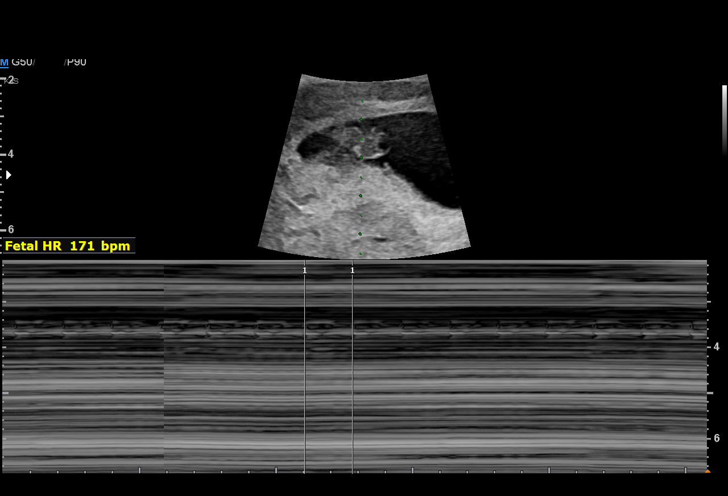
[im 32/35]
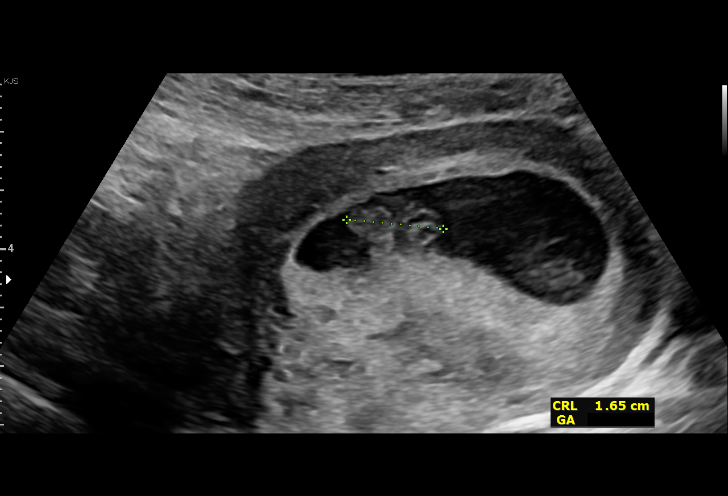
[im 35/35]
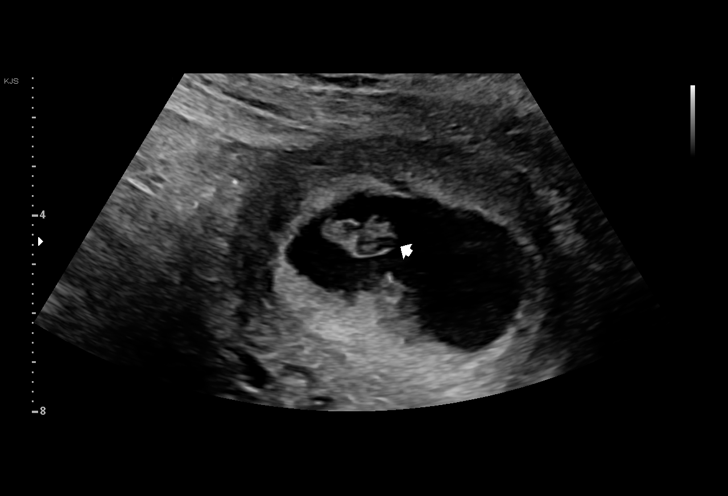

[15 of 28 positions shown; findings below may reference images not displayed]

FINDINGS: Intrauterine gestational sac: Single intrauterine gestational sac.

Yolk sac:  Seen

Embryo:  Present

Cardiac Activity: Detected

Heart Rate: 171 bpm

CRL: 16 mm   8 w 0 d                  US EDC: [DATE]

Subchorionic hemorrhage:  None visualized.

Maternal uterus/adnexae: The maternal ovaries are unremarkable.
IMPRESSION: Single live intrauterine pregnancy with an estimated gestational age
of 8 weeks, 0 days based on ultrasound, concordant with age based on
LMP.

## 2021-05-23 NOTE — MAU Provider Note (Signed)
History     CSN: [id number]  Arrival date and time: 05/23/21 1232   Event Date/Time   First Provider Initiated Contact with Patient 05/23/21 1332      Chief Complaint  Patient presents with   Abdominal Pain   Vaginal Bleeding   HPI  [name] is a  30 y.o. female [phone] @ 2250w5d here in MAU with complaints of vaginal bleeding and abdominal pain. LMP unknown. Last night she noticed brown discharge in her under ware. This morning she went to the bathroom and wiped and saw the brown discharge. No bright red bleeding. She has been having pain in her lower abdomen; both sides. She has not taken anything for the pain. Last intercourse was Sunday. She currently rates her pain 3/10.   Patient is self pay and concerned about cost today.  + constipation.   OB History     Gravida  4   Para  1   Term  1   Preterm  0   AB  2   Living  1      SAB  1   IAB  1   Ectopic  0   Multiple  0   Live Births  1           Past Medical History:  Diagnosis Date   History of anemia    History of elective abortion    History of spontaneous abortion     Past Surgical History:  Procedure Laterality Date   denies surgical history      Family History  Problem Relation Age of Onset   Hypertension Mother    Allergies Mother    Migraines Sister    Migraines Sister    Cancer Maternal Grandmother     Social History   Tobacco Use   Smoking status: Never   Smokeless tobacco: Never  Vaping Use   Vaping Use: Never used  Substance Use Topics   Alcohol use: Yes    Alcohol/week: 1.0 - 5.0 standard drink    Types: 1 Glasses of wine per week    Comment:  Last use one glass of wine 2 days ago.   Drug use: Yes    Types: Marijuana    Comment: 07/2020    Allergies: No Known Allergies  Medications Prior to Admission  Medication Sig Dispense Refill Last Dose   Prenatal Vit-Fe Fumarate-FA (PRENATAL MULTIVITAMIN) TABS tablet Take 1 tablet by mouth daily at 12  noon.   05/22/2021   ibuprofen (ADVIL,MOTRIN) 600 MG tablet Take 1 tablet (600 mg total) by mouth every 6 (six) hours. (Patient not taking: No sig reported) 30 tablet 0    levonorgestrel (MIRENA) 20 MCG/24HR IUD 1 each by Intrauterine route once.      metroNIDAZOLE (FLAGYL) 500 MG tablet Take 1 tablet (500 mg total) 2 (two) times daily by mouth. (Patient not taking: No sig reported) 14 tablet 0    MULTIPLE VITAMIN PO Take by mouth. (Patient not taking: No sig reported)      norethindrone (CAMILA) 0.35 MG tablet Take 1 tablet (0.35 mg total) by mouth daily. (Patient not taking: No sig reported) 1 Package 11    Results for orders placed or performed during the hospital encounter of 05/23/21 (from the past 48 hour(s))  Urinalysis, Routine w reflex microscopic Urine, Clean Catch     Status: Abnormal   Collection Time: 05/23/21  1:03 PM  Result Value Ref Range   Color, Urine YELLOW YELLOW   APPearance  HAZY (A) CLEAR   Specific Gravity, Urine 1.016 1.005 - 1.030   pH 9.0 (H) 5.0 - 8.0   Glucose, UA NEGATIVE NEGATIVE mg/dL   Hgb urine dipstick SMALL (A) NEGATIVE   Bilirubin Urine NEGATIVE NEGATIVE   Ketones, ur 5 (A) NEGATIVE mg/dL   Protein, ur NEGATIVE NEGATIVE mg/dL   Nitrite NEGATIVE NEGATIVE   Leukocytes,Ua NEGATIVE NEGATIVE   RBC / HPF 0-5 0 - 5 RBC/hpf   WBC, UA 0-5 0 - 5 WBC/hpf   Bacteria, UA NONE SEEN NONE SEEN   Squamous Epithelial / LPF 11-20 0 - 5   Mucus PRESENT     Comment: Performed at [facility], [address], [place], [place] [address]  Pregnancy, urine POC     Status: Abnormal   Collection Time: 05/23/21  1:05 PM  Result Value Ref Range   Preg Test, Ur POSITIVE (A) NEGATIVE    Comment:        THE SENSITIVITY OF THIS METHODOLOGY IS >24 mIU/mL   CBC with Differential/Platelet     Status: None   Collection Time: 05/23/21  1:47 PM  Result Value Ref Range   WBC 7.7 4.0 - 10.5 K/uL   RBC 4.06 3.87 - 5.11 MIL/uL   Hemoglobin 12.6 12.0 - 15.0 g/dL   HCT 13.2  44.0 - 10.2 %   MCV 91.4 80.0 - 100.0 fL   MCH 31.0 26.0 - 34.0 pg   MCHC 34.0 30.0 - 36.0 g/dL   RDW 72.5 36.6 - 44.0 %   Platelets 217 150 - 400 K/uL   nRBC 0.0 0.0 - 0.2 %   Neutrophils Relative % 76 %   Neutro Abs 5.8 1.7 - 7.7 K/uL   Lymphocytes Relative 18 %   Lymphs Abs 1.4 0.7 - 4.0 K/uL   Monocytes Relative 5 %   Monocytes Absolute 0.4 0.1 - 1.0 K/uL   Eosinophils Relative 1 %   Eosinophils Absolute 0.1 0.0 - 0.5 K/uL   Basophils Relative 0 %   Basophils Absolute 0.0 0.0 - 0.1 K/uL   Immature Granulocytes 0 %   Abs Immature Granulocytes 0.02 0.00 - 0.07 K/uL    Comment: Performed at [facility], [address], [place], [place] [address]  Comprehensive metabolic panel     Status: Abnormal   Collection Time: 05/23/21  1:47 PM  Result Value Ref Range   Sodium 135 135 - 145 mmol/L   Potassium 4.1 3.5 - 5.1 mmol/L   Chloride 106 98 - 111 mmol/L   CO2 24 22 - 32 mmol/L   Glucose, Bld 84 70 - 99 mg/dL    Comment: Glucose reference range applies only to samples taken after fasting for at least 8 hours.   BUN 7 6 - 20 mg/dL   Creatinine, Ser 5.95 0.44 - 1.00 mg/dL   Calcium 9.1 8.9 - 63.8 mg/dL   Total Protein 6.1 (L) 6.5 - 8.1 g/dL   Albumin 3.5 3.5 - 5.0 g/dL   AST 15 15 - 41 U/L   ALT 13 0 - 44 U/L   Alkaline Phosphatase 45 38 - 126 U/L   Total Bilirubin 0.2 (L) 0.3 - 1.2 mg/dL   GFR, Estimated >75 >64 mL/min    Comment: (NOTE) Calculated using the CKD-EPI Creatinine Equation (2021)    Anion gap 5 5 - 15    Comment: Performed at [facility], [address] [place], [place] [address]  hCG, quantitative, pregnancy  Status: Abnormal   Collection Time: 05/23/21  1:47 PM  Result Value Ref Range   hCG, Beta Chain, Quant, S 503,025 (H) <5 mIU/mL    Comment: RESULTS CONFIRMED BY MANUAL DILUTION   GEST. AGE      CONC.  (mIU/mL)   <=1 WEEK        5 - 50     2 WEEKS       50 - 500     3 WEEKS       100 - 10,000     4 WEEKS     1,000 - 30,000     5  WEEKS     3,500 - 115,000   6-8 WEEKS     12,000 - 270,000    12 WEEKS     15,000 - 220,000        FEMALE AND NON-PREGNANT FEMALE:     LESS THAN 5 mIU/mL Performed at [facility], [address] [place], [place] [address]   Wet prep, genital     Status: Abnormal   Collection Time: 05/23/21  1:47 PM  Result Value Ref Range   Yeast Wet Prep HPF POC NONE SEEN NONE SEEN   Trich, Wet Prep NONE SEEN NONE SEEN   Clue Cells Wet Prep HPF POC NONE SEEN NONE SEEN   WBC, Wet Prep HPF POC MANY (A) NONE SEEN   Sperm NONE SEEN     Comment: Performed at [facility], [address] [place], [place] [address]  HIV Antibody (routine testing w rflx)     Status: None   Collection Time: 05/23/21  1:47 PM  Result Value Ref Range   HIV Screen 4th Generation wRfx Non Reactive Non Reactive    Comment: Performed at [facility], [address], [place], [place] [address]    [place] Comp Less 14 Wks  Result Date: 05/23/2021 CLINICAL DATA:  30 year old pregnant female with spotting. LMP: 03/30/2021 corresponding to an estimated gestational age of [redacted] weeks, 5 days. EXAM: OBSTETRIC <14 WK ULTRASOUND TECHNIQUE: Transabdominal ultrasound was performed for evaluation of the gestation as well as the maternal uterus and adnexal regions. COMPARISON:  None. FINDINGS: Intrauterine gestational sac: Single intrauterine gestational sac. Yolk sac:  Seen Embryo:  Present Cardiac Activity: Detected Heart Rate: 171 bpm CRL: 16 mm   8 w 0 d                  [place] EDC: 01/02/2022 Subchorionic hemorrhage:  None visualized. Maternal uterus/adnexae: The maternal ovaries are unremarkable. IMPRESSION: Single live intrauterine pregnancy with an estimated gestational age of [redacted] weeks, 0 days based on ultrasound, concordant with age based on LMP. Electronically Signed   By: Elgie Collard M.D.   On: 05/23/2021 14:56     Review of Systems  Gastrointestinal:  Positive for abdominal pain.  Genitourinary:  Positive for vaginal  bleeding.  Physical Exam   Blood pressure 116/73, pulse 68, temperature 98.2 F (36.8 C), resp. rate 16, height 5\' 7"  (1.702 m), weight 78.9 kg, last menstrual period 03/30/2021, SpO2 100 %.  Physical Exam Constitutional:      General: She is not in acute distress.    Appearance: She is not ill-appearing, toxic-appearing or diaphoretic.  Abdominal:     Tenderness: There is no abdominal tenderness.  Genitourinary:    Comments: Wet prep and GC collected without speculum   Skin:    General: Skin is warm.  Neurological:  Mental Status: She is alert and oriented to person, place, and time.  Psychiatric:        Behavior: Behavior normal.   MAU Course  Procedures None  MDM  A positive blood type.  Wet prep & GC HIV, CBC, Hcg, ABO US OB transvaginal    Assessment and Plan   A:  1. Abdominal pain during pregnancy in first trimester   2. Vaginal bleeding during pregnancy   3. [redacted] weeks gestation of pregnancy   4. Normal intrauterine pregnancy on prenatal ultrasound in first trimester      P:  Discharge home in stable condition Return to MAU if symptoms worsen  Start prenatal care Prenatal vitamins daily Pelvic rest  Saleema Weppler, Madison Ruther[place], NP 05/24/2021 2:20 PM

## 2021-05-23 NOTE — MAU Note (Signed)
Preg confirmed at HD in [place].  Based on period, was like 6 wks. That was 1-2 wks ago.  Noted last night brown spotting in her panties, saw it again this morning. About a month ago, had a little pain in lower abd.  Now feels little pain in lower abd- not as bad as cramps.

## 2021-05-24 LAB — GC/CHLAMYDIA PROBE AMP (~~LOC~~) NOT AT ARMC
Chlamydia: NEGATIVE
Comment: NEGATIVE
Comment: NORMAL
Neisseria Gonorrhea: NEGATIVE

## 2021-05-25 LAB — CULTURE, OB URINE: Culture: NO GROWTH

## 2021-06-13 ENCOUNTER — Telehealth: Payer: Self-pay | Admitting: Clinical

## 2021-06-13 ENCOUNTER — Other Ambulatory Visit: Payer: Self-pay

## 2021-06-13 ENCOUNTER — Encounter: Payer: Self-pay | Admitting: Women's Health

## 2021-06-13 ENCOUNTER — Ambulatory Visit (INDEPENDENT_AMBULATORY_CARE_PROVIDER_SITE_OTHER): Payer: Self-pay | Admitting: Women's Health

## 2021-06-13 VITALS — BP 111/72 | HR 86 | Wt 166.4 lb

## 2021-06-13 DIAGNOSIS — Z3480 Encounter for supervision of other normal pregnancy, unspecified trimester: Secondary | ICD-10-CM | POA: Insufficient documentation

## 2021-06-13 DIAGNOSIS — Z3A10 10 weeks gestation of pregnancy: Secondary | ICD-10-CM | POA: Insufficient documentation

## 2021-06-13 LAB — HEPATITIS C ANTIBODY: HCV Ab: NEGATIVE

## 2021-06-13 NOTE — Patient Instructions (Signed)
Maternity Assessment Unit (MAU)  The Maternity Assessment Unit (MAU) is located at the [facility]. The address is: [address], [address], [place], [place] [address]. Please see map below for additional directions.    The Maternity Assessment Unit is designed to help you during your pregnancy, and for up to 6 weeks after delivery, with any pregnancy- or postpartum-related emergencies, if you think you are in labor, or if your water has broken. For example, if you experience nausea and vomiting, vaginal bleeding, severe abdominal or pelvic pain, elevated blood pressure or other problems related to your pregnancy or postpartum time, please come to the Maternity Assessment Unit for assistance.       Obstetrics: Normal and Problem Pregnancies (7th ed., pp. 102-121). [place], [place]: Elsevier."> Textbook of Family Medicine (9th ed., pp. 365-410). [place], [place]: [employer].">  First Trimester of Pregnancy  The first trimester of pregnancy starts on the first day of your last menstrual period until the end of week 12. This is months 1 through 3 of pregnancy. A week after a sperm fertilizes an egg, the egg will implant into the wall of the uterus and begin to develop into a baby. By the end of 12 weeks, all the baby'sorgans will be formed and the baby will be 2-3 inches in size. Body changes during your first trimester Your body goes through many changes during pregnancy. The changes vary andgenerally return to normal after your baby is born. Physical changes You may gain or lose weight. Your breasts may begin to grow larger and become tender. The tissue that surrounds your nipples (areola) may become darker. Dark spots or blotches (chloasma or mask of pregnancy) may develop on your face. You may have changes in your hair. These can include thickening or thinning of your hair or changes in texture. Health changes You may feel nauseous, and  you may vomit. You may have heartburn. You may develop headaches. You may develop constipation. Your gums may bleed and may be sensitive to brushing and flossing. Other changes You may tire easily. You may urinate more often. Your menstrual periods will stop. You may have a loss of appetite. You may develop cravings for certain kinds of food. You may have changes in your emotions from day to day. You may have more vivid and strange dreams. Follow these instructions at home: Medicines Follow your health care provider's instructions regarding medicine use. Specific medicines may be either safe or unsafe to take during pregnancy. Do not take any medicines unless told to by your health care provider. Take a prenatal vitamin that contains at least 600 micrograms (mcg) of folic acid. Eating and drinking Eat a healthy diet that includes fresh fruits and vegetables, whole grains, good sources of protein such as meat, eggs, or tofu, and low-fat dairy products. Avoid raw meat and unpasteurized juice, milk, and cheese. These carry germs that can harm you and your baby. If you feel nauseous or you vomit: Eat 4 or 5 small meals a day instead of 3 large meals. Try eating a few soda crackers. Drink liquids between meals instead of during meals. You may need to take these actions to prevent or treat constipation: Drink enough fluid to keep your urine pale yellow. Eat foods that are high in fiber, such as beans, whole grains, and fresh fruits and vegetables. Limit foods that are high in fat and processed sugars, such as fried or sweet foods. Activity Exercise only as directed   by your health care provider. Most people can continue their usual exercise routine during pregnancy. Try to exercise for 30 minutes at least 5 days a week. Stop exercising if you develop pain or cramping in the lower abdomen or lower back. Avoid exercising if it is very hot or humid or if you are at high altitude. Avoid heavy  lifting. If you choose to, you may have sex unless your health care provider tells you not to. Relieving pain and discomfort Wear a good support bra to relieve breast tenderness. Rest with your legs elevated if you have leg cramps or low back pain. If you develop bulging veins (varicose veins) in your legs: Wear support hose as told by your health care provider. Elevate your feet for 15 minutes, 3-4 times a day. Limit salt in your diet. Safety Wear your seat belt at all times when driving or riding in a car. Talk with your health care provider if someone is verbally or physically abusive to you. Talk with your health care provider if you are feeling sad or have thoughts of hurting yourself. Lifestyle Do not use hot tubs, steam rooms, or saunas. Do not douche. Do not use tampons or scented sanitary pads. Do not use herbal remedies, alcohol, illegal drugs, or medicines that are not approved by your health care provider. Chemicals in these products can harm your baby. Do not use any products that contain nicotine or tobacco, such as cigarettes, e-cigarettes, and chewing tobacco. If you need help quitting, ask your health care provider. Avoid cat litter boxes and soil used by cats. These carry germs that can cause birth defects in the baby and possibly loss of the unborn baby (fetus) by miscarriage or stillbirth. General instructions During routine prenatal visits in the first trimester, your health care provider will do a physical exam, perform necessary tests, and ask you how things are going. Keep all follow-up visits. This is important. Ask for help if you have counseling or nutritional needs during pregnancy. Your health care provider can offer advice or refer you to specialists for help with various needs. Schedule a dentist appointment. At home, brush your teeth with a soft toothbrush. Floss gently. Write down your questions. Take them to your prenatal visits. Where to find more  information American Pregnancy Association: americanpregnancy.org American College of Obstetricians and Gynecologists: acog.org/en/Womens%20Health/Pregnancy Office on Women's Health: womenshealth.gov/pregnancy Contact a health care provider if you have: Dizziness. A fever. Mild pelvic cramps, pelvic pressure, or nagging pain in the abdominal area. Nausea, vomiting, or diarrhea that lasts for 24 hours or longer. A bad-smelling vaginal discharge. Pain when you urinate. Known exposure to a contagious illness, such as chickenpox, measles, Zika virus, HIV, or hepatitis. Get help right away if you have: Spotting or bleeding from your vagina. Severe abdominal cramping or pain. Shortness of breath or chest pain. Any kind of trauma, such as from a fall or a car crash. New or increased pain, swelling, or redness in an arm or leg. Summary The first trimester of pregnancy starts on the first day of your last menstrual period until the end of week 12 (months 1 through 3). Eating 4 or 5 small meals a day rather than 3 large meals may help to relieve nausea and vomiting. Do not use any products that contain nicotine or tobacco, such as cigarettes, e-cigarettes, and chewing tobacco. If you need help quitting, ask your health care provider. Keep all follow-up visits. This is important. This information is not intended to replace   advice given to you by your health care provider. Make sure you discuss any questions you have with your healthcare provider. Document Revised: 04/26/2020 Document Reviewed: 03/02/2020 Elsevier Patient Education  2022 Elsevier Inc.                        Safe Medications in Pregnancy    Acne: Benzoyl Peroxide Salicylic Acid  Backache/Headache: Tylenol: 2 regular strength every 4 hours OR              2 Extra strength every 6 hours  Colds/Coughs/Allergies: Benadryl (alcohol free) 25 mg every 6 hours as needed Breath right strips Claritin Cepacol throat  lozenges Chloraseptic throat spray Cold-Eeze- up to three times per day Cough drops, alcohol free Flonase (by prescription only) Guaifenesin Mucinex Robitussin DM (plain only, alcohol free) Saline nasal spray/drops Sudafed (pseudoephedrine) & Actifed ** use only after [redacted] weeks gestation and if you do not have high blood pressure Tylenol Vicks Vaporub Zinc lozenges Zyrtec   Constipation: Colace Ducolax suppositories Fleet enema Glycerin suppositories Metamucil Milk of magnesia Miralax Senokot Smooth move tea  Diarrhea: Kaopectate Imodium A-D  *NO pepto Bismol  Hemorrhoids: Anusol Anusol HC Preparation H Tucks  Indigestion: Tums Maalox Mylanta Zantac  Pepcid  Insomnia: Benadryl (alcohol free) 25mg every 6 hours as needed Tylenol PM Unisom, no Gelcaps  Leg Cramps: Tums MagGel  Nausea/Vomiting:  Bonine Dramamine Emetrol Ginger extract Sea bands Meclizine  Nausea medication to take during pregnancy:  Unisom (doxylamine succinate 25 mg tablets) Take one tablet daily at bedtime. If symptoms are not adequately controlled, the dose can be increased to a maximum recommended dose of two tablets daily (1/2 tablet in the morning, 1/2 tablet mid-afternoon and one at bedtime). Vitamin B6 100mg tablets. Take one tablet twice a day (up to 200 mg per day).  Skin Rashes: Aveeno products Benadryl cream or 25mg every 6 hours as needed Calamine Lotion 1% cortisone cream  Yeast infection: Gyne-lotrimin 7 Monistat 7   **If taking multiple medications, please check labels to avoid duplicating the same active ingredients **take medication as directed on the label ** Do not exceed 4000 mg of tylenol in 24 hours **Do not take medications that contain aspirin or ibuprofen          

## 2021-06-13 NOTE — Telephone Encounter (Signed)
Brief mood check, as requested: Pt is mourning loss of 31yo niece (sister's daughter), due to babysitter's negligence, in May 2022; shortly after, pt found she was pregnant. Pt doesn't feel she's had time to mourn the loss of her niece, is finding it "difficult to get out of my thoughts", not sleeping well since loss;nausea continues;  helps to talk through conflicting feelings.   Pt agrees [facility] Asher Muir will call again on Thursday afternoon, for additional mood check; will set up MyChart as soon as possible, and has MyChart Help Desk number to use as needed.  Pt denies SI, denies any plan or intent of harm to herself during this time, agrees to use [facility] 24/7 if SI begins.

## 2021-06-13 NOTE — Progress Notes (Signed)
Pt appt cancelled in favor of warm hand off to J. McMannes for PHQ-9 score 25.  Christne Platts, Odie Sera, NP  10:40 AM 06/21/2021

## 2021-06-13 NOTE — Progress Notes (Signed)
NOB in office, U/S done on 05-23-21 at MAU. Pt reports occasional cramping, and nausea and vomiting. Last pap 09/2020. Pt reports feeling depressed today, niece passed away in 04-15-2023. Pt is currently married and this was a planned pregnancy but states that she feels that husband does not understand her feelings/concerns. PHQ-9= 25

## 2021-06-14 ENCOUNTER — Telehealth: Payer: Self-pay | Admitting: Clinical

## 2021-06-14 LAB — COMPREHENSIVE METABOLIC PANEL
ALT: 14 IU/L (ref 0–32)
AST: 15 IU/L (ref 0–40)
Albumin/Globulin Ratio: 1.9 (ref 1.2–2.2)
Albumin: 4.2 g/dL (ref 3.8–4.8)
Alkaline Phosphatase: 54 IU/L (ref 44–121)
BUN/Creatinine Ratio: 14 (ref 9–23)
BUN: 7 mg/dL (ref 6–20)
Bilirubin Total: 0.2 mg/dL (ref 0.0–1.2)
CO2: 18 mmol/L — ABNORMAL LOW (ref 20–29)
Calcium: 9.4 mg/dL (ref 8.7–10.2)
Chloride: 102 mmol/L (ref 96–106)
Creatinine, Ser: 0.49 mg/dL — ABNORMAL LOW (ref 0.57–1.00)
Globulin, Total: 2.2 g/dL (ref 1.5–4.5)
Glucose: 116 mg/dL — ABNORMAL HIGH (ref 65–99)
Potassium: 3.9 mmol/L (ref 3.5–5.2)
Sodium: 138 mmol/L (ref 134–144)
Total Protein: 6.4 g/dL (ref 6.0–8.5)
eGFR: 129 mL/min/{1.73_m2} (ref >59)

## 2021-06-14 LAB — OBSTETRIC PANEL, INCLUDING HIV
Antibody Screen: NEGATIVE
Basophils Absolute: 0 10*3/uL (ref 0.0–0.2)
Basos: 0 %
EOS (ABSOLUTE): 0 10*3/uL (ref 0.0–0.4)
Eos: 1 %
HIV Screen 4th Generation wRfx: NONREACTIVE
Hematocrit: 35.1 % (ref 34.0–46.6)
Hemoglobin: 12.1 g/dL (ref 11.1–15.9)
Hepatitis B Surface Ag: NEGATIVE
Immature Grans (Abs): 0 10*3/uL (ref 0.0–0.1)
Immature Granulocytes: 0 %
Lymphocytes Absolute: 1.3 10*3/uL (ref 0.7–3.1)
Lymphs: 15 %
MCH: 30.6 pg (ref 26.6–33.0)
MCHC: 34.5 g/dL (ref 31.5–35.7)
MCV: 89 fL (ref 79–97)
Monocytes Absolute: 0.4 10*3/uL (ref 0.1–0.9)
Monocytes: 5 %
Neutrophils Absolute: 7.1 10*3/uL — ABNORMAL HIGH (ref 1.4–7.0)
Neutrophils: 79 %
Platelets: 233 10*3/uL (ref 150–450)
RBC: 3.96 x10E6/uL (ref 3.77–5.28)
RDW: 12.6 % (ref 11.7–15.4)
RPR Ser Ql: NONREACTIVE
Rh Factor: POSITIVE
Rubella Antibodies, IGG: 7.62 index (ref >0.99)
WBC: 8.9 10*3/uL (ref 3.4–10.8)

## 2021-06-14 LAB — PROTEIN / CREATININE RATIO, URINE
Creatinine, Urine: 223.8 mg/dL
Protein, Ur: 15.1 mg/dL
Protein/Creat Ratio: 67 mg/g creat (ref 0–200)

## 2021-06-14 LAB — HEMOGLOBIN A1C
Est. average glucose Bld gHb Est-mCnc: 103 mg/dL
Hgb A1c MFr Bld: 5.2 % (ref 4.8–5.6)

## 2021-06-14 LAB — HEPATITIS C ANTIBODY: Hep C Virus Ab: 0.1 s/co ratio (ref 0.0–0.9)

## 2021-06-14 LAB — OB RESULTS CONSOLE RUBELLA ANTIBODY, IGM: Rubella: IMMUNE

## 2021-06-14 LAB — RUBELLA ANTIBODY, IGM: Rubella IgM: <20 AU/mL (ref 0.0–19.9)

## 2021-06-14 NOTE — Telephone Encounter (Addendum)
Attempt f/u call, as agreed-upon by pt and [facility]: Unable to leave phone message as voicemail has not been set up; Unable to leave MyChart message, as has not been set up yet.  --------------------------------------------------------------------------------  Update: pt returned call, is feeling much better today after taking melatonin as directed by medical provider, " a little drowsy" in the morning upon waking, but feeling rested; spent the day with her mother and sister; pt says that processing feelings yesterday was more helpful than she expected, and is very appreciative of the care she received at her visit.   Pt agrees to:  call MyChart Help Desk, as she's having difficulty setting up MyChart Madison Hood agrees to send [facility] grief support information when MyChart is set up, for her to share with her sister and family) Madison Hood out [employer] paperwork Call [facility] as needed at [phone]; Madison Hood will call for brief check-in in about 2 weeks

## 2021-06-15 LAB — URINE CULTURE

## 2021-06-18 ENCOUNTER — Telehealth: Payer: Self-pay

## 2021-06-18 NOTE — Telephone Encounter (Signed)
Patient returned my call regarding her headaches. Patient states that her headache started Sunday night. She has not tried taking anything for the pain. Patient advised that she can take 1000 mg tylenol every for 4-6 hours for headaches. Patient also advised to increase water intake and try resting in a dark room. Instructed her to call us back if headache is still unrelieved with the tylenol.  Patient does not have a BP cuff at home to take BP. Made a note in sticky note for patient to be provided with one at her next appt.   Patient verbalized understanding.

## 2021-06-18 NOTE — Telephone Encounter (Signed)
Patient called and left a message on triage vm stating that she has been having really bad headaches x 2 nights.  I called patient back to discuss sx's. Unable to leave message due to vm not being set up.

## 2021-06-21 ENCOUNTER — Encounter: Payer: Self-pay | Admitting: Women's Health

## 2021-06-25 ENCOUNTER — Encounter: Payer: Self-pay | Admitting: Women's Health

## 2021-06-27 ENCOUNTER — Ambulatory Visit (INDEPENDENT_AMBULATORY_CARE_PROVIDER_SITE_OTHER): Payer: Self-pay | Admitting: Women's Health

## 2021-06-27 ENCOUNTER — Other Ambulatory Visit: Payer: Self-pay

## 2021-06-27 VITALS — BP 102/67 | HR 75 | Wt 165.0 lb

## 2021-06-27 DIAGNOSIS — Z3A12 12 weeks gestation of pregnancy: Secondary | ICD-10-CM

## 2021-06-27 DIAGNOSIS — F320 Major depressive disorder, single episode, mild: Secondary | ICD-10-CM

## 2021-06-27 DIAGNOSIS — F419 Anxiety disorder, unspecified: Secondary | ICD-10-CM

## 2021-06-27 DIAGNOSIS — Z3480 Encounter for supervision of other normal pregnancy, unspecified trimester: Secondary | ICD-10-CM

## 2021-06-27 DIAGNOSIS — R87618 Other abnormal cytological findings on specimens from cervix uteri: Secondary | ICD-10-CM

## 2021-06-27 NOTE — Patient Instructions (Addendum)
Maternity Assessment Unit (MAU)  The Maternity Assessment Unit (MAU) is located at the [facility] and [facility] at [facility]. The address is: [address] [place], [place], [place] [address]. Please see map below for additional directions.    The Maternity Assessment Unit is designed to help you during your pregnancy, and for up to 6 weeks after delivery, with any pregnancy- or postpartum-related emergencies, if you think you are in labor, or if your water has broken. For example, if you experience nausea and vomiting, vaginal bleeding, severe abdominal or pelvic pain, elevated blood pressure or other problems related to your pregnancy or postpartum time, please come to the Maternity Assessment Unit for assistance.                        Safe Medications in Pregnancy    Acne: Benzoyl Peroxide Salicylic Acid  Backache/Headache: Tylenol: 2 regular strength every 4 hours OR              2 Extra strength every 6 hours  Colds/Coughs/Allergies: Benadryl (alcohol free) 25 mg every 6 hours as needed Breath right strips Claritin Cepacol throat lozenges Chloraseptic throat spray Cold-Eeze- up to three times per day Cough drops, alcohol free Flonase (by prescription only) Guaifenesin Mucinex Robitussin DM (plain only, alcohol free) Saline nasal spray/drops Sudafed (pseudoephedrine) & Actifed ** use only after [redacted] weeks gestation and if you do not have high blood pressure Tylenol Vicks Vaporub Zinc lozenges Zyrtec   Constipation: Colace Ducolax suppositories Fleet enema Glycerin suppositories Metamucil Milk of magnesia Miralax Senokot Smooth move tea  Diarrhea: Kaopectate Imodium A-D  *NO pepto Bismol  Hemorrhoids: Anusol Anusol HC Preparation H Tucks  Indigestion: Tums Maalox Mylanta Zantac  Pepcid  Insomnia: Benadryl (alcohol free)  every 6 hours as needed Tylenol PM Unisom, no Gelcaps  Leg  Cramps: Tums MagGel  Nausea/Vomiting:  Bonine Dramamine Emetrol Ginger extract Sea bands Meclizine  Nausea medication to take during pregnancy:  Unisom (doxylamine succinate 25 mg tablets) Take one tablet daily at bedtime. If symptoms are not adequately controlled, the dose can be increased to a maximum recommended dose of two tablets daily (1/2 tablet in the morning, 1/2 tablet mid-afternoon and one at bedtime). Vitamin B6  tablets. Take one tablet twice a day (up to 200 mg per day).  Skin Rashes: Aveeno products Benadryl cream or  every 6 hours as needed Calamine Lotion 1% cortisone cream  Yeast infection: Gyne-lotrimin 7 Monistat 7   **If taking multiple medications, please check labels to avoid duplicating the same active ingredients **take medication as directed on the label ** Do not exceed 4000 mg of tylenol in 24 hours **Do not take medications that contain aspirin or ibuprofen         Preterm Labor The normal length of a pregnancy is 39-41 weeks. Preterm labor is when labor starts before 37 completed weeks of pregnancy. Babies who are born prematurely and survive may not be fully developed and may be at an increased risk for long-term problems such as cerebral palsy, developmental delays, and vision andhearing problems. Babies who are born too early may have problems soon after birth. Premature babies may have problems regulating blood sugar, body temperature, heart rate, and breathing rate. These babies often have trouble with feeding. The risk ofhaving problems is highest for babies who are born before 34 weeks of pregnancy. What are the causes? The exact cause of this condition is not known. What increases  the risk? You are more likely to have preterm labor if you have certain risk factors that relate to your medical history, problems with present and past pregnancies, andlifestyle factors. Medical history You have abnormalities of the uterus,  including a short cervix. You have STIs (sexually transmitted infections) or other infections of the urinary tract and the vagina. You have chronic illnesses, such as blood clotting problems, diabetes, or high blood pressure. You are overweight or underweight. Present and past pregnancies You have had preterm labor before. You are pregnant with twins or other multiples. You have been diagnosed with a condition in which the placenta covers your cervix (placenta previa). You waited less than 18 months between giving birth and becoming pregnant again. Your unborn baby has some abnormalities. You have vaginal bleeding during pregnancy. You became pregnant through in vitro fertilization (IVF). Lifestyle and environmental factors You use tobacco products or drink alcohol. You use drugs. You have stress and no social support. You experience domestic violence. You are exposed to certain chemicals or environmental pollutants. Other factors You are younger than age 53 or older than age 68. What are the signs or symptoms? Symptoms of this condition include: Cramps similar to those that can happen during a menstrual period. The cramps may happen with diarrhea. Pain in the abdomen or lower back. Regular contractions that may feel like tightening of the abdomen. A feeling of increased pressure in the pelvis. Increased watery or bloody mucus discharge from the vagina. Water breaking (ruptured amniotic sac). How is this diagnosed? This condition is diagnosed based on: Your medical history and a physical exam. A pelvic exam. An ultrasound. Monitoring your uterus for contractions. Other tests, including: A swab of the cervix to check for a chemical called fetal fibronectin. Urine tests. How is this treated? Treatment for this condition depends on the length of your pregnancy, your condition, and the health of your baby. Treatment may include: Taking medicines, such as: Hormone medicines. These  may be given early in pregnancy to help support the pregnancy. Medicines to stop contractions. Medicines to help mature the baby's lungs. These may be prescribed if the risk of delivery is high. Medicines to help protect your baby from brain and nerve complications such as cerebral palsy. Bed rest. If the labor happens before 34 weeks of pregnancy, you may need to stay in the hospital. Delivery of the baby. Follow these instructions at home:  Do not use any products that contain nicotine or tobacco. These products include cigarettes, chewing tobacco, and vaping devices, such as e-cigarettes. If you need help quitting, ask your health care provider. Do not drink alcohol. Take over-the-counter and prescription medicines only as told by your health care provider. Rest as told by your health care provider. Return to your normal activities as told by your health care provider. Ask your health care provider what activities are safe for you. Keep all follow-up visits. This is important. How is this prevented? To increase your chance of having a full-term pregnancy: Do not use drugs or take medicines that have not been prescribed to you during your pregnancy. Talk with your health care provider before taking any herbal supplements, even if you have been taking them regularly. Make sure you gain a healthy amount of weight during your pregnancy. Watch for infection. If you think that you might have an infection, get it checked right away. Symptoms of infection may include: Fever. Abnormal vaginal discharge or discharge that smells bad. Pain or burning with urination. Needing  to urinate urgently. Frequently urinating or passing small amounts of urine frequently. Blood in your urine or urine that smells bad or unusual. Where to find more information U.S. Department of Health and CytogeneticistHuman Services Office on Women's Health: http://hoffman.com/www.womenshealth.gov The Cel[employer] Corporationmerican College of Obstetricians and Gynecologists:  www.acog.org Centers for Disease Control and Prevention, Preterm Birth: FootballExhibition.com.brwww.cdc.gov Contact a health care provider if: You think you are going into preterm labor. You have signs or symptoms of preterm labor. You have symptoms of infection. Get help right away if: You are having regular, painful contractions every 5 minutes or less. Your water breaks. Summary Preterm labor is labor that starts before you reach 37 weeks of pregnancy. Delivering your baby early increases your baby's risk of developing long-term problems. You are more likely to have preterm labor if you have certain risk factors that relate to your medical history, problems with present and past pregnancies, and lifestyle factors. Keep all follow-up visits. This is important. Contact a health care provider if you have signs or symptoms of preterm labor. This information is not intended to replace advice given to you by your health care provider. Make sure you discuss any questions you have with your healthcare provider. Document Revised: 11/21/2020 Document Reviewed: 11/21/2020 Elsevier Patient Education  2022 [employer].       [facility] Resource Guide (Revised August 2014)    Insufficient Money for Medicine:           [address]: call "211"   MAP Program at [facility] [place] [phone] or [place] [phone]            No Primary Care Doctor:  To locate a primary care doctor that accepts your insurance or provides certain services:           [place] [facility]: [phone]           Physician Referral Service: [phone] ask for "My Skillman" If no insurance, you need to see if you qualify for [facility] "orange card", call to set      up appointment for eligibility/enrollment at [phone] or [phone] or visit [facility] and [employer] ([address], [place] and [address] -[place]) to meet with a [facility] enrollment specialist.  Agencies that provide inexpensive  (sliding fee scale) medical care:      [facility] - Family Medicine at [place] - [phone]    [facility]  -  [facility] [phone]- [phone]    [facility] - [phone]    [facility] - [phone]    [facility] [phone]- [phone]    [facility] [phone]- [phone] [facility] - [facility] @ [place] [phone]- [phone]-     1050 [facility] - [facility] @ [place] - [phone] [facility]: [phone]  Women's Clinic: [phone]  Planned Parenthood: [phone]  [facility] [place]- 336- [phone]    Medicaid-accepting [facility] Providers:           [facility] [phone]- [phone] (No Family Planning accepted)          [address], [phone], Mon-Fri 9am-5pm          [facility] - [phone] [address], [address], Mon-Thursday 8am-5pm, Fri 8am-noon [facility] -  [phone]          [address], [address], Mon-Fri 7:30am-4:30pm          [facility] - [phone]          [address] [place] 8am-5pm          [facility] - [phone] [address], [address]          Only accepts [place] [employer] patients after they have their name applied to their card  Self Pay (no insurance) in [facility]:           Sickle Cell Patients:  [address] [place], [phone] [facility] Internal Medicine: [address] [place] [phone]       [facility] [address], [place] [phone]  [facility] Family Practice: [address] [phone]          [facility]           [address] [place], [phone] [facility] [address]  [place], [phone]           [facility] [place]           [address], [place] [phone]        [facility] - [address]           [phone], Mon-Fri 9am-7pm, [place] 9am-1pm          [facility] - Family Medicine @ [facility]          [address] [place], [phone]          [facility] - [facility]           [address] [phone] [facility] - [facility] [address], [place] [phone]          [facility]           [address] [phone]  [facility] - [facility]  [address] [place], [phone] [facility] - [facility] [address], [phone]  Dr. Julio Sicks           [address], [place], [phone]          [facility]           [address] [phone]          [facility]             [address] [phone]          [facility]           [address] [place], [phone], 1st & 3rd Saturday every month, 10am-1pm OTHERS:  Control and instrumentation engineer  (Immigration [employer] Only)  [phone] (Thursday only) Strategies for finding a Primary Care Provider:  1) Find a Doctor and Pay Out of Pocket  Although you won't have to find out who is covered by your insurance plan, it is a good idea to ask around and get recommendations. You will then need to call the office and see if the doctor you have  chosen will accept you as a new patient and what types of options they offer for patients who are self-pay. Some doctors offer discounts or will set up payment plans for their patients who do not have insurance, but you will need to ask so you aren't surprised when you get to your appointment.  2) Contact [place] [employer] - To see if you qualify  for "orange card" access to healthcare safety net providers.  Call for appointment for eligibility/enrollment at [phone] or [phone]. (Uninsured, 0-200% FPL, qualifying info)  Applicants for [facility] are first required to see if they are eligible to enroll in the [facility] Marketplace before enrolling in [facility] (and get an exemption if they are not).  GCCN Criteria for acceptance is:  Proof of Engineering geologist exemption - form or documentation  Valid photo ID (driver's license, state identification card, passport, home country ID)  Proof of [facility] residency (e.g. driver's license, lease/landlord information, pay stubs with address, utility bill, bank statement, etc.)  Proof of income (1040, last year's tax return, W2, 4 current pay stubs, other income proof)  Proof of assets (current bank statement + 3 most recent, disability paperwork, life insurance info, tax value on autos, etc.)  3) Contact Your Local Health Department  Not all health departments have doctors that can see patients for sick visits, but many do, so it is worth a call to see if yours does. If you don't know where your local health department is, you can check in your phone book. The CDC also has a tool to help you locate your state's health department, and many state websites also have listings of all of their local health departments.  4) Find a Walk-in Clinic  If your illness is not likely to be very severe or complicated, you may want to try a walk in clinic. These are popping up all over the country in pharmacies, drugstores, and shopping centers. They're usually staffed by nurse practitioners or physician assistants that have been trained to treat common illnesses and complaints. They're usually fairly quick and inexpensive. However, if you have serious medical issues or chronic medical problems, these are probably not your best option   STD Testing:           [facility] [place], [place]  Clinic           [address], [place], phone [phone] or [phone]           Monday - Friday, call for an appointment          [facility] Department of [facility], [place] Clinic           [address], [place], phone [phone] or [phone]           Monday - Friday, call for an appointment Abuse/Neglect:           [facility] Child Abuse Hotline: 343-463-4324           [facility] Child Abuse Hotline: 253-780-9253 (After Hours) Emergency Shelter:  [facility] [phone]) 432-2063  [facility] [place]- [phone]  [facility] [place] - [phone])625-8489  Youth Focus - Act Together - 512-632-6284 (ages 15-17)  Homeless Day Shelter @ [employer] - 3325870481   Mammograms - Free at [facility] [phone]  Maternity Homes:           Room at the [place] of the  [facility]: [phone]   (Homeless mother with children)          Rebeca Alert Services: (928)214-7748 (Mothers only) Youth Focus: 902-617-1967 (Pregnant 57-14 years old) Adopt a Mom -([phone]  [facility] Resources  [facility] - Lanae Boast [address], [place] [phone]          [facility] [place]           [address] [place]           [phone]          [place]           [address] [place], [place]           [phone]          [facility].           [address], [place]           [phone]          [facility] Mental Health           [phone]          [facility]           [phone]          [facility] in [place]           [address]           [phone], [facility] Child Abuse Hotline           725-314-5470           [phone] (After Hours)  Behavioral Health Services /Substance Abuse  Resources:           Alcohol and Drug Services: (605)739-8016           [facility]: [phone]          [facility]: [phone]  Narcotics Helpline - [phone]          [facility]: 657-064-4486           Residential & Outpatient Substance Abuse Program - [facility] [place]: [phone])687-6891 [facility]  [facility] - 647-341-9371 Psychological Services:          [facility]: [phone]  Therapeutic Alternatives: 270-002-0539          [facility] Mental Health           [address] [place]           ACCESS LINE: 2120414300     (24 Hour) Mobile Crisis:  HELPLINES:  Financial risk analyst on Mental Illness - [place] [phone] [facility] on Mental Illness - [place] [phone] Walk In [facility] - [address] - [place]  [phone]       [facility] - [phone] or [phone]  [facility] - [phone] S[address] - [employer] [phone]  [facility] [phone]. [address], [place] [phone]   Dental Assistance:  If unable to pay or uninsured, contact: [facility]. to become qualified for the adult dental clinic. Patient must be enrolled in [facility] (uninsured, 0-200% FPL, qualifying info).  Enroll in [facility] first, then see Primary Care  Physician assigned to you, the PCP makes a dental referral. [facility] will receive referral and contacts patient for appointment.  Patients with Medicaid           [address] [phone]  [facility] (Children up to 20 + Pregnant Women) - [phone]  [facility] Dentistry - [address] - [address] [phone]  If unable to pay, or uninsured: contact [facility] Department [phone] in [place] - (Children only + Pregnant Women), [phone] in [facility]- Children only) to become qualified for the  adult dental clinic  Must see if eligible to enroll in [facility] Marketplace before enrolling into the [facility] (exemption required) [phone] for an appointment)  BigFaster.co.uk;   [phone].  If not eligible for ACA, then go by Department of Health and Human Services to see if eligible for "orange card."  [address] [place] and [phone]  Once you get an orange card, you will have a Primary Care home who will then refer you to dental if needed.     Other IT consultant:   [facility] [phone] (ext [phone])   [address]  Dr. Lawrence Marseilles - [phone]   [address]    [place] - [phone]   [facility]           [address] [place], [place], [place], [address]           [phone], Ext. [phone]           2nd and 4th Thursday of the month at 6:30am (Simple extractions only - no wisdom teeth or surgery) First come/First serve -First 10 clients served           [facility], [place] and [place] residents only)          [address] [place], [place], [address]           [phone]                    [facility] Health Department           [phone]          [facility] Health Department          [phone]         [facility] Health Department - Children's Dental Clinic          [phone]   Transportation Options:  Ambulance - 911 - $250-$700 per ride Family Member to accompany patient (if stable) [place] [employer] - [phone]  PART - [phone]  Taxi - [phone] - [place]  SCAT - [phone] (Application required)  [facility] - [phone]

## 2021-06-27 NOTE — Progress Notes (Signed)
History:   Madison Hood is a 30 y.o. [id number] at 52w5dby LMP, early ultrasound being seen today for her first obstetrical visit.  Her obstetrical history is significant for  depression, hx SABx2 . Patient does intend to breast feed. Pregnancy history fully reviewed.  Allergies: NKDA Current Medications: Unisom PRN, PNV PMH: depression, anxiety. No HTN, DM, asthma. PSH: none OB Hx: 2009 (SAB), 2012 (SAB + D&C), 2017 (NSVD, full-term) Social Hx: pt reports using marijuana prior to finding out she was pregnant, but stopped once she found out. pt does not smoke, drink, or use drugs. Family Hx: none  Patient reports no complaints.      HISTORY: OB History  Gravida Para Term Preterm AB Living  '4 1 1 ' 0 2 1  SAB IAB Ectopic Multiple Live Births  1 1 0 0 1    # Outcome Date GA Lbr Len/2nd Weight Sex Delivery Anes PTL Lv  4 Current           3 Term 06/08/16 [id number]:21 / 03:37 8 lb 8.5 oz (3.87 kg) F Vag-Spont EPI  LIV     Name: TOSEVILLA, MURTAGH   Apgar1: 9  Apgar5: 9  2 IAB 2012             Birth Comments: D & C, ([place] 1 SAB 2009 6w58w0d        Last pap smear was done 09/2020 and was abnormal - HPV+, NILM, needs repeat Pap 1 year  Past Medical History:  Diagnosis Date   History of anemia    History of elective abortion    History of spontaneous abortion    Past Surgical History:  Procedure Laterality Date   denies surgical history     Family History  Problem Relation Age of Onset   Hypertension Mother    Allergies Mother    Migraines Sister    Migraines Sister    Cancer Maternal Grandmother    Social History   Tobacco Use   Smoking status: Never   Smokeless tobacco: Never  Vaping Use   Vaping Use: Never used  Substance Use Topics   Alcohol use: Not Currently    Alcohol/week: 1.0 - 5.0 standard drink    Types: 1 Glasses of wine per week    Comment:  Last use one glass of wine 2 days ago.   Drug use: Not Currently    Types:  Marijuana    Comment: stopped in May 2022   No Known Allergies Current Outpatient Medications on File Prior to Visit  Medication Sig Dispense Refill   Prenatal Vit-Fe Fumarate-FA (PRENATAL MULTIVITAMIN) TABS tablet Take 1 tablet by mouth daily at 12 noon.     No current facility-administered medications on file prior to visit.    Review of Systems Pertinent items noted in HPI and remainder of comprehensive ROS otherwise negative.  Physical Exam:   Vitals:   06/27/21 1603  BP: 102/67  Pulse: 75  Weight: 165 lb (74.8 kg)      General: well-developed, well-nourished female in no acute distress  Breasts:  Declined per pt  Skin: normal coloration and turgor, no rashes  Neurologic: oriented, normal, negative, normal mood  Extremities: normal strength, tone, and muscle mass, ROM of all joints is normal  HEENT PERRLA, extraocular movement intact and sclera clear, anicteric  Neck supple and no masses  Cardiovascular: regular rate and rhythm  Respiratory:  no respiratory distress, normal breath sounds  Abdomen:  soft, non-tender; bowel sounds normal; no masses,  no organomegaly  Pelvic: Not indicated    Assessment:    Pregnancy: [id number] Patient Active Problem List   Diagnosis Date Noted   Supervision of other normal pregnancy, antepartum 06/13/2021   Abnormal Pap smear of cervix  09/06/20 neg HPV + 09/14/2020   Anxiety 09/06/2020   Major depressive disorder, single episode, 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 Supervision of other normal pregnancy, antepartum  2. Other abnormal cytological finding of specimen from cervix -needs repeat Pap 09/2021  3. Anxiety -has met with Roselyn Reef, community resources given  4. Major depressive disorder, single episode, mild ([place]) -has met with Roselyn Reef, community resources given  5. [redacted] weeks gestation of pregnancy  Initial labs drawn two weeks ago. Continue prenatal vitamins. Problem list reviewed and updated. Genetic Screening  discussed, not covered by [employer]. Ultrasound discussed; fetal anatomic survey: ordered. Anticipatory guidance about prenatal visits given including labs, ultrasounds, and testing. Discussed usage of [employer] and virtual visits as additional source of managing and completing prenatal visits in midst of coronavirus and pandemic.   Encouraged to complete MyChart Registration for her ability to review results, send requests, and have questions addressed.  The nature of [place] for [facility] with multiple MDs and Advanced Practice Providers was explained to patient; also emphasized that residents, students are part of our team. Routine obstetric precautions reviewed. Encouraged to seek out care at office or emergency room [facility] preferred) for urgent and/or emergent concerns. Return in about 4 weeks (around 07/25/2021) for in-person LOB/APP OK/needs anatomy scan with [place].     Clarisa Fling, NP  3:36 AM 07/02/2021

## 2021-06-27 NOTE — Progress Notes (Signed)
Pt states she has had an episode of feeling faint, states she got hot and vision was blacking out.  Pt states she is not really eating and drinking well and has been constipated.

## 2021-06-28 ENCOUNTER — Telehealth: Payer: Self-pay | Admitting: Clinical

## 2021-06-28 NOTE — Telephone Encounter (Signed)
Attempt to f/u with pt, as agreed-upon; Unable to leave voice message as voicemail is not set up; unable to leave MyChart message as has not been set up yet.   Pt may contact MyChart Help Desk at [phone] for help setting up her account.

## 2021-07-02 ENCOUNTER — Encounter: Payer: Self-pay | Admitting: Women's Health

## 2021-07-25 ENCOUNTER — Ambulatory Visit (INDEPENDENT_AMBULATORY_CARE_PROVIDER_SITE_OTHER): Payer: Self-pay | Admitting: Women's Health

## 2021-07-25 ENCOUNTER — Other Ambulatory Visit: Payer: Self-pay

## 2021-07-25 VITALS — BP 107/66 | HR 81 | Wt 172.4 lb

## 2021-07-25 DIAGNOSIS — Z3480 Encounter for supervision of other normal pregnancy, unspecified trimester: Secondary | ICD-10-CM

## 2021-07-25 DIAGNOSIS — Z3A16 16 weeks gestation of pregnancy: Secondary | ICD-10-CM

## 2021-07-25 NOTE — Progress Notes (Signed)
Subjective:  Madison Hood is a 30 y.o. [id number] at [redacted]w[redacted]d being seen today for ongoing prenatal care.  She is currently monitored for the following issues for this low-risk pregnancy and has Major depressive disorder, single episode, mild ([facility]); Anxiety; Abnormal Pap smear of cervix  09/06/20 neg HPV +; and Supervision of other normal pregnancy, antepartum on their problem list.  Patient reports no complaints.  Contractions: Irritability. Vag. Bleeding: None.  Movement: Present. Denies leaking of fluid.   The following portions of the patient's history were reviewed and updated as appropriate: allergies, current medications, past family history, past medical history, past social history, past surgical history and problem list. Problem list updated.  Objective:   Vitals:   07/25/21 1534  BP: 107/66  Pulse: 81  Weight: 172 lb 6.4 oz (78.2 kg)    Fetal Status: Fetal Heart Rate (bpm): 153   Movement: Present     General:  Alert, oriented and cooperative. Patient is in no acute distress.  Skin: Skin is warm and dry. No rash noted.   Cardiovascular: Normal heart rate noted  Respiratory: Normal respiratory effort, no problems with respiration noted  Abdomen: Soft, gravid, appropriate for gestational age. Pain/Pressure: Present     Pelvic: Vag. Bleeding: None     Cervical exam deferred        Extremities: Normal range of motion.  Edema: None  Mental Status: Normal mood and affect. Normal behavior. Normal judgment and thought content.   Urinalysis:      Assessment and Plan:  Pregnancy: [id number] at [redacted]w[redacted]d  1. Supervision of other normal pregnancy, antepartum - AFP, Serum, Open Spina Bifida  2. [redacted] weeks gestation of pregnancy  Preterm labor symptoms and general obstetric precautions including but not limited to vaginal bleeding, contractions, leaking of fluid and fetal movement were reviewed in detail with the patient. I discussed the assessment and treatment plan with the patient.  The patient was provided an opportunity to ask questions and all were answered. The patient agreed with the plan and demonstrated an understanding of the instructions. The patient was advised to call back or seek an in-person office evaluation/go to [facility]U at [facility] for any urgent or concerning symptoms. Please refer to After Visit Summary for other counseling recommendations.  Return in about 4 weeks (around 08/22/2021) for in-person LOB/APP OK.   Gearldene Fiorenza, Odie Sera, NP

## 2021-07-25 NOTE — Progress Notes (Signed)
Pt reports feeling fetal flutter movements with occasional right pelvic cramping, denies bleeding.

## 2021-07-25 NOTE — Patient Instructions (Addendum)
Maternity Assessment Unit (MAU)  The Maternity Assessment Unit (MAU) is located at the [facility] at [facility]. The address is: [address], [place], [place], [place] [address]. Please see map below for additional directions.    The Maternity Assessment Unit is designed to help you during your pregnancy, and for up to 6 weeks after delivery, with any pregnancy- or postpartum-related emergencies, if you think you are in labor, or if your water has broken. For example, if you experience nausea and vomiting, vaginal bleeding, severe abdominal or pelvic pain, elevated blood pressure or other problems related to your pregnancy or postpartum time, please come to the Maternity Assessment Unit for assistance.       Second Trimester of Pregnancy  The second trimester of pregnancy is from week 13 through week 27. This is months 4 through 6 of pregnancy. The second trimester is often a time when you feel your best. Your body has adjusted to being pregnant, and you begin to feelbetter physically. During the second trimester: Morning sickness has lessened or stopped completely. You may have more energy. You may have an increase in appetite. The second trimester is also a time when the unborn baby (fetus) is growing rapidly. At the end of the sixth month, the fetus may be up to 12 inches long and weigh about 1 pounds. You will likely begin to feel the baby move (quickening) between 16 and 20 weeks of pregnancy. Body changes during your second trimester Your body continues to go through many changes during your second trimester.The changes vary and generally return to normal after the baby is born. Physical changes Your weight will continue to increase. You will notice your lower abdomen bulging out. You may begin to get stretch marks on your hips, abdomen, and breasts. Your breasts will continue to grow and to become tender. Dark spots or blotches (chloasma or  mask of pregnancy) may develop on your face. A dark line from your belly button to the pubic area (linea nigra) may appear. You may have changes in your hair. These can include thickening of your hair, rapid growth, and changes in texture. Some people also have hair loss during or after pregnancy, or hair that feels dry or thin. Health changes You may develop headaches. You may have heartburn. You may develop constipation. You may develop hemorrhoids or swollen, bulging veins (varicose veins). Your gums may bleed and may be sensitive to brushing and flossing. You may urinate more often because the fetus is pressing on your bladder. You may have back pain. This is caused by: Weight gain. Pregnancy hormones that are relaxing the joints in your pelvis. A shift in weight and the muscles that support your balance. Follow these instructions at home: Medicines Follow your health care provider's instructions regarding medicine use. Specific medicines may be either safe or unsafe to take during pregnancy. Do not take any medicines unless approved by your health care provider. Take a prenatal vitamin that contains at least 600 micrograms (mcg) of folic acid. Eating and drinking Eat a healthy diet that includes fresh fruits and vegetables, whole grains, good sources of protein such as meat, eggs, or tofu, and low-fat dairy products. Avoid raw meat and unpasteurized juice, milk, and cheese. These carry germs that can harm you and your baby. You may need to take these actions to prevent or treat constipation: Drink enough fluid to keep your urine pale yellow. Eat foods that are high in fiber, such as  beans, whole grains, and fresh fruits and vegetables. Limit foods that are high in fat and processed sugars, such as fried or sweet foods. Activity Exercise only as directed by your health care provider. Most people can continue their usual exercise routine during pregnancy. Try to exercise for 30 minutes  at least 5 days a week. Stop exercising if you develop contractions in your uterus. Stop exercising if you develop pain or cramping in the lower abdomen or lower back. Avoid exercising if it is very hot or humid or if you are at a high altitude. Avoid heavy lifting. If you choose to, you may have sex unless your health care provider tells you not to. Relieving pain and discomfort Wear a supportive bra to prevent discomfort from breast tenderness. Take warm sitz baths to soothe any pain or discomfort caused by hemorrhoids. Use hemorrhoid cream if your health care provider approves. Rest with your legs raised (elevated) if you have leg cramps or low back pain. If you develop varicose veins: Wear support hose as told by your health care provider. Elevate your feet for 15 minutes, 3-4 times a day. Limit salt in your diet. Safety Wear your seat belt at all times when driving or riding in a car. Talk with your health care provider if someone is verbally or physically abusive to you. Lifestyle Do not use hot tubs, steam rooms, or saunas. Do not douche. Do not use tampons or scented sanitary pads. Avoid cat litter boxes and soil used by cats. These carry germs that can cause birth defects in the baby and possibly loss of the fetus by miscarriage or stillbirth. Do not use herbal remedies, alcohol, illegal drugs, or medicines that are not approved by your health care provider. Chemicals in these products can harm your baby. Do not use any products that contain nicotine or tobacco, such as cigarettes, e-cigarettes, and chewing tobacco. If you need help quitting, ask your health care provider. General instructions During a routine prenatal visit, your health care provider will do a physical exam and other tests. He or she will also discuss your overall health. Keep all follow-up visits. This is important. Ask your health care provider for a referral to a local prenatal education class. Ask for help if  you have counseling or nutritional needs during pregnancy. Your health care provider can offer advice or refer you to specialists for help with various needs. Where to find more information American Pregnancy Association: americanpregnancy.org [employer]ians and Gynecologists: https://www.todd-brady.net/ Office on [employer] Health: MightyReward.co.nz Contact a health care provider if you have: A headache that does not go away when you take medicine. Vision changes or you see spots in front of your eyes. Mild pelvic cramps, pelvic pressure, or nagging pain in the abdominal area. Persistent nausea, vomiting, or diarrhea. A bad-smelling vaginal discharge or foul-smelling urine. Pain when you urinate. Sudden or extreme swelling of your face, hands, ankles, feet, or legs. A fever. Get help right away if you: Have fluid leaking from your vagina. Have spotting or bleeding from your vagina. Have severe abdominal cramping or pain. Have difficulty breathing. Have chest pain. Have fainting spells. Have not felt your baby move for the time period told by your health care provider. Have new or increased pain, swelling, or redness in an arm or leg. Summary The second trimester of pregnancy is from week 13 through week 27 (months 4 through 6). Do not use herbal remedies, alcohol, illegal drugs, or medicines that are not approved  by your health care provider. Chemicals in these products can harm your baby. Exercise only as directed by your health care provider. Most people can continue their usual exercise routine during pregnancy. Keep all follow-up visits. This is important. This information is not intended to replace advice given to you by your health care provider. Make sure you discuss any questions you have with your healthcare provider. Document Revised: 04/26/2020 Document Reviewed: 03/02/2020 Elsevier Patient Education  2022 Elsevier Inc.       Round  Ligament Pain  The round ligament is a cord of muscle and tissue that helps support the uterus. It can become a source of pain during pregnancy if it becomes stretched or twisted as the baby grows. The pain usually begins in the second trimester (13-28 weeks) of pregnancy, and it can come and go until the baby is delivered.It is not a serious problem, and it does not cause harm to the baby. Round ligament pain is usually a short, sharp, and pinching pain, but it can also be a dull, lingering, and aching pain. The pain is felt in the lower side of the abdomen or in the groin. It usually starts deep in the groin and moves up to the outside of the hip area. The pain may occur when you: Suddenly change position, such as quickly going from a sitting to standing position. Roll over in bed. Cough or sneeze. Do physical activity. Follow these instructions at home:  Watch your condition for any changes. When the pain starts, relax. Then try any of these methods to help with the pain: Sitting down. Flexing your knees up to your abdomen. Lying on your side with one pillow under your abdomen and another pillow between your legs. Sitting in a warm bath for 15-20 minutes or until the pain goes away. Take over-the-counter and prescription medicines only as told by your health care provider. Move slowly when you sit down or stand up. Avoid long walks if they cause pain. Stop or reduce your physical activities if they cause pain. Keep all follow-up visits as told by your health care provider. This is important. Contact a health care provider if: Your pain does not go away with treatment. You feel pain in your back that you did not have before. Your medicine is not helping. Get help right away if: You have a fever or chills. You develop uterine contractions. You have vaginal bleeding. You have nausea or vomiting. You have diarrhea. You have pain when you urinate. Summary Round ligament pain is felt in  the lower abdomen or groin. It is usually a short, sharp, and pinching pain. It can also be a dull, lingering, and aching pain. This pain usually begins in the second trimester (13-28 weeks). It occurs because the uterus is stretching with the growing baby, and it is not harmful to the baby. You may notice the pain when you suddenly change position, when you cough or sneeze, or during physical activity. Relaxing, flexing your knees to your abdomen, lying on one side, or taking a warm bath may help to get rid of the pain. Get help from your health care provider if the pain does not go away or if you have vaginal bleeding, nausea, vomiting, diarrhea, or painful urination. This information is not intended to replace advice given to you by your health care provider. Make sure you discuss any questions you have with your healthcare provider. Document Revised: 11/03/2020 Document Reviewed: 05/06/2018 Elsevier Patient Education  2022 [employer].  Alpha-Fetoprotein Test Why am I having this test? The alpha-fetoprotein test is a lab test most commonly used for pregnant women to help screen for birth defects in their unborn baby. It can be used to screen for chromosome (DNA) abnormalities, problems with the brain or spinal cord, or problems with the abdominal wall of the unborn baby (fetus). The alpha-fetoprotein test may also be done for men or nonpregnant women tocheck for certain cancers. What is being tested? This test measures the amount of alpha-fetoprotein (AFP) in your blood. AFP is a protein that is made by the liver. Levels can be detected in the mother's blood during pregnancy, starting at 10 weeks and peaking at 16-18 weeks of the pregnancy. Abnormal levels can sometimes be a sign of a birth defect in thebaby. Certain cancers can cause a high level of AFP in men and nonpregnant women. What kind of sample is taken?  A blood sample is required for this test. It is usually collected by  insertinga needle into a blood vessel. How are the results reported? Your test results will be reported as values. Your health care provider will compare your results to normal ranges that were established after testing a large group of people (reference values). Reference values may vary among labs and hospitals. For this test, common reference values are: Adult: Less than 40 ng/mL or less than 40 mcg/L (SI units). Child younger than 1 year: Less than 30 ng/mL. If you are pregnant, the values may also vary based on how long you have beenpregnant. What do the results mean? Results that are above the reference values in pregnant women may indicate the following for the baby: Neural tube defects, such as abnormalities of the spinal cord or brain. Abdominal wall defects. Multiple pregnancy such as twins. Fetal distress or fetal death. Results that are above the reference values in men or nonpregnant women may indicate: Reproductive cancers, such as ovarian or testicular cancer. Liver cancer. Liver cell death. Other types of cancer. Very low levels of AFP in pregnant women may indicate Down syndrome for thebaby. Talk with your health care provider about what your results mean. Questions to ask your health care provider Ask your health care provider, or the department that is doing the test: When will my results be ready? How will I get my results? What are my treatment options? What other tests do I need? What are my next steps? Summary The alpha-fetoprotein test is done on pregnant women to help screen for birth defects in their unborn baby. Certain cancers can cause a high level of AFP in men and nonpregnant women. For this test, a blood sample is usually collected by inserting a needle into a blood vessel. Talk with your health care provider about what your results mean. This information is not intended to replace advice given to you by your health care provider. Make sure you discuss  any questions you have with your healthcare provider. Document Revised: 06/09/2020 Document Reviewed: 06/09/2020 Elsevier Patient Education  2022 Elsevier Inc.       Preterm Labor The normal length of a pregnancy is 39-41 weeks. Preterm labor is when labor starts before 37 completed weeks of pregnancy. Babies who are born prematurely and survive may not be fully developed and may be at an increased risk for long-term problems such as cerebral palsy, developmental delays, and vision andhearing problems. Babies who are born too early may have problems soon after birth. Premature babies may have problems regulating blood sugar, body  temperature, heart rate, and breathing rate. These babies often have trouble with feeding. The risk ofhaving problems is highest for babies who are born before 34 weeks of pregnancy. What are the causes? The exact cause of this condition is not known. What increases the risk? You are more likely to have preterm labor if you have certain risk factors that relate to your medical history, problems with present and past pregnancies, andlifestyle factors. Medical history You have abnormalities of the uterus, including a short cervix. You have STIs (sexually transmitted infections) or other infections of the urinary tract and the vagina. You have chronic illnesses, such as blood clotting problems, diabetes, or high blood pressure. You are overweight or underweight. Present and past pregnancies You have had preterm labor before. You are pregnant with twins or other multiples. You have been diagnosed with a condition in which the placenta covers your cervix (placenta previa). You waited less than 18 months between giving birth and becoming pregnant again. Your unborn baby has some abnormalities. You have vaginal bleeding during pregnancy. You became pregnant through in vitro fertilization (IVF). Lifestyle and environmental factors You use tobacco products or drink  alcohol. You use drugs. You have stress and no social support. You experience domestic violence. You are exposed to certain chemicals or environmental pollutants. Other factors You are younger than age 36 or older than age 61. What are the signs or symptoms? Symptoms of this condition include: Cramps similar to those that can happen during a menstrual period. The cramps may happen with diarrhea. Pain in the abdomen or lower back. Regular contractions that may feel like tightening of the abdomen. A feeling of increased pressure in the pelvis. Increased watery or bloody mucus discharge from the vagina. Water breaking (ruptured amniotic sac). How is this diagnosed? This condition is diagnosed based on: Your medical history and a physical exam. A pelvic exam. An ultrasound. Monitoring your uterus for contractions. Other tests, including: A swab of the cervix to check for a chemical called fetal fibronectin. Urine tests. How is this treated? Treatment for this condition depends on the length of your pregnancy, your condition, and the health of your baby. Treatment may include: Taking medicines, such as: Hormone medicines. These may be given early in pregnancy to help support the pregnancy. Medicines to stop contractions. Medicines to help mature the baby's lungs. These may be prescribed if the risk of delivery is high. Medicines to help protect your baby from brain and nerve complications such as cerebral palsy. Bed rest. If the labor happens before 34 weeks of pregnancy, you may need to stay in the hospital. Delivery of the baby. Follow these instructions at home:  Do not use any products that contain nicotine or tobacco. These products include cigarettes, chewing tobacco, and vaping devices, such as e-cigarettes. If you need help quitting, ask your health care provider. Do not drink alcohol. Take over-the-counter and prescription medicines only as told by your health care  provider. Rest as told by your health care provider. Return to your normal activities as told by your health care provider. Ask your health care provider what activities are safe for you. Keep all follow-up visits. This is important. How is this prevented? To increase your chance of having a full-term pregnancy: Do not use drugs or take medicines that have not been prescribed to you during your pregnancy. Talk with your health care provider before taking any herbal supplements, even if you have been taking them regularly. Make sure you gain a  healthy amount of weight during your pregnancy. Watch for infection. If you think that you might have an infection, get it checked right away. Symptoms of infection may include: Fever. Abnormal vaginal discharge or discharge that smells bad. Pain or burning with urination. Needing to urinate urgently. Frequently urinating or passing small amounts of urine frequently. Blood in your urine or urine that smells bad or unusual. Where to find more information U.S. Department of Health and Cytogeneticist on Women's Health: http://hoffman.com/ [employer]ians and Gynecologists: www.acog.org Centers for Disease Control and Prevention, Preterm Birth: FootballExhibition.com.br Contact a health care provider if: You think you are going into preterm labor. You have signs or symptoms of preterm labor. You have symptoms of infection. Get help right away if: You are having regular, painful contractions every 5 minutes or less. Your water breaks. Summary Preterm labor is labor that starts before you reach 37 weeks of pregnancy. Delivering your baby early increases your baby's risk of developing long-term problems. You are more likely to have preterm labor if you have certain risk factors that relate to your medical history, problems with present and past pregnancies, and lifestyle factors. Keep all follow-up visits. This is important. Contact a  health care provider if you have signs or symptoms of preterm labor. This information is not intended to replace advice given to you by your health care provider. Make sure you discuss any questions you have with your healthcare provider. Document Revised: 11/21/2020 Document Reviewed: 11/21/2020 Elsevier Patient Education  2022 [employer].

## 2021-07-27 LAB — AFP, SERUM, OPEN SPINA BIFIDA
AFP MoM: 1.58
AFP Value: 54 ng/mL
Gest. Age on Collection Date: 16.5 weeks
Maternal Age At EDD: 32 yr
OSBR Risk 1 IN: 2212
Test Results:: NEGATIVE
Weight: 172 [lb_av]

## 2021-07-31 ENCOUNTER — Telehealth: Payer: Self-pay | Admitting: *Deleted

## 2021-07-31 NOTE — Telephone Encounter (Signed)
Returned patient call regarding recent lab results.  Patient does not want to log in to MyChart. Patient notified that all results were WNL. Informed of blood type and RH.

## 2021-08-22 ENCOUNTER — Ambulatory Visit (INDEPENDENT_AMBULATORY_CARE_PROVIDER_SITE_OTHER): Payer: Self-pay | Admitting: Women's Health

## 2021-08-22 ENCOUNTER — Other Ambulatory Visit: Payer: Self-pay

## 2021-08-22 VITALS — BP 106/67 | HR 73 | Wt 178.2 lb

## 2021-08-22 DIAGNOSIS — K5900 Constipation, unspecified: Secondary | ICD-10-CM

## 2021-08-22 DIAGNOSIS — O99612 Diseases of the digestive system complicating pregnancy, second trimester: Secondary | ICD-10-CM

## 2021-08-22 DIAGNOSIS — Z3480 Encounter for supervision of other normal pregnancy, unspecified trimester: Secondary | ICD-10-CM

## 2021-08-22 DIAGNOSIS — Z3A20 20 weeks gestation of pregnancy: Secondary | ICD-10-CM

## 2021-08-22 NOTE — Progress Notes (Signed)
Patient presents for ROB. Patient states that she is still having a hard time having bowel movements. She also states that she is having some intermittent cramping.

## 2021-08-22 NOTE — Progress Notes (Signed)
Subjective:  Madison Hood is a 30 y.o. G4P1021 at 102w5d being seen today for ongoing prenatal care.  She is currently monitored for the following issues for this low-risk pregnancy and has Major depressive disorder, single episode, mild (HCC); Anxiety; Abnormal Pap smear of cervix  09/06/20 neg HPV +; Supervision of other normal pregnancy, antepartum; and Constipation during pregnancy in second trimester on their problem list.  Patient reports  constipation .  Contractions: Irritability. Vag. Bleeding: None.  Movement: Present. Denies leaking of fluid.   The following portions of the patient's history were reviewed and updated as appropriate: allergies, current medications, past family history, past medical history, past social history, past surgical history and problem list. Problem list updated.  Objective:   Vitals:   08/22/21 1426  BP: 106/67  Pulse: 73  Weight: 178 lb 3.2 oz (80.8 kg)    Fetal Status: Fetal Heart Rate (bpm): 150   Movement: Present     General:  Alert, oriented and cooperative. Patient is in no acute distress.  Skin: Skin is warm and dry. No rash noted.   Cardiovascular: Normal heart rate noted  Respiratory: Normal respiratory effort, no problems with respiration noted  Abdomen: Soft, gravid, appropriate for gestational age. Pain/Pressure: Absent     Pelvic: Vag. Bleeding: None     Cervical exam deferred        Extremities: Normal range of motion.  Edema: None  Mental Status: Normal mood and affect. Normal behavior. Normal judgment and thought content.   Urinalysis:      Assessment and Plan:  Pregnancy: G4P1021 at [redacted]w[redacted]d  1. Supervision of other normal pregnancy, antepartum -contraception info given -peds list given -CBE info given  2. Constipation during pregnancy in second trimester -discussed stool softeners -Miralax protocol given  3. [redacted] weeks gestation of pregnancy  Preterm labor symptoms and general obstetric precautions including but not  limited to vaginal bleeding, contractions, leaking of fluid and fetal movement were reviewed in detail with the patient. I discussed the assessment and treatment plan with the patient. The patient was provided an opportunity to ask questions and all were answered. The patient agreed with the plan and demonstrated an understanding of the instructions. The patient was advised to call back or seek an in-person office evaluation/go to [facility] at [facility] for any urgent or concerning symptoms. Please refer to After Visit Summary for other counseling recommendations.  Return in about 4 weeks (around 09/19/2021) for in-person LOB/APP OK, need [place] records.   Janique Hoefer, Odie Sera, NP

## 2021-08-22 NOTE — Patient Instructions (Signed)
Maternity Assessment Unit (MAU)  The Maternity Assessment Unit (MAU) is located at the [facility] and [facility] at [facility]. The address is: [address] [place], [place], [place] [address]. Please see map below for additional directions.    The Maternity Assessment Unit is designed to help you during your pregnancy, and for up to 6 weeks after delivery, with any pregnancy- or postpartum-related emergencies, if you think you are in labor, or if your water has broken. For example, if you experience nausea and vomiting, vaginal bleeding, severe abdominal or pelvic pain, elevated blood pressure or other problems related to your pregnancy or postpartum time, please come to the Maternity Assessment Unit for assistance.                        Safe Medications in Pregnancy    Acne: Benzoyl Peroxide Salicylic Acid  Backache/Headache: Tylenol: 2 regular strength every 4 hours OR              2 Extra strength every 6 hours  Colds/Coughs/Allergies: Benadryl (alcohol free) 25 mg every 6 hours as needed Breath right strips Claritin Cepacol throat lozenges Chloraseptic throat spray Cold-Eeze- up to three times per day Cough drops, alcohol free Flonase (by prescription only) Guaifenesin Mucinex Robitussin DM (plain only, alcohol free) Saline nasal spray/drops Sudafed (pseudoephedrine) & Actifed ** use only after [redacted] weeks gestation and if you do not have high blood pressure Tylenol Vicks Vaporub Zinc lozenges Zyrtec   Constipation: Colace Ducolax suppositories Fleet enema Glycerin suppositories Metamucil Milk of magnesia Miralax Senokot Smooth move tea  Diarrhea: Kaopectate Imodium A-D  *NO pepto Bismol  Hemorrhoids: Anusol Anusol HC Preparation H Tucks  Indigestion: Tums Maalox Mylanta Zantac  Pepcid  Insomnia: Benadryl (alcohol free) 25mg  every 6 hours as needed Tylenol PM Unisom, no Gelcaps  Leg  Cramps: Tums MagGel  Nausea/Vomiting:  Bonine Dramamine Emetrol Ginger extract Sea bands Meclizine  Nausea medication to take during pregnancy:  Unisom (doxylamine succinate 25 mg tablets) Take one tablet daily at bedtime. If symptoms are not adequately controlled, the dose can be increased to a maximum recommended dose of two tablets daily (1/2 tablet in the morning, 1/2 tablet mid-afternoon and one at bedtime). Vitamin B6 100mg  tablets. Take one tablet twice a day (up to 200 mg per day).  Skin Rashes: Aveeno products Benadryl cream or 25mg  every 6 hours as needed Calamine Lotion 1% cortisone cream  Yeast infection: Gyne-lotrimin 7 Monistat 7   **If taking multiple medications, please check labels to avoid duplicating the same active ingredients **take medication as directed on the label ** Do not exceed 4000 mg of tylenol in 24 hours **Do not take medications that contain aspirin or ibuprofen         You have constipation which is hard stools that are difficult to pass. It is important to have regular bowel movements every 1-3 days that are soft and easy to pass. Hard stools increase your risk of hemorrhoids and are very uncomfortable.   To prevent constipation you can increase the amount of fiber in your diet. Examples of foods with fiber are leafy greens, whole grain breads, oatmeal and other grains.  It is also important to drink at least eight 8oz glass of water everyday.   If you have not has a bowel movement in 4-5 days you made need to clean out your bowel.  This will have establish normal movement through your bowel.  Miralax Clean out Take 8 capfuls of miralax in 64 oz of gatorade. You can use any fluid that appeals to you (gatorade, water, juice) Continue to drink at least eight 8 oz glasses of water throughout the day You can repeat with another 8 capfuls of miralax in 64 oz of gatorade if you are not having a large amount of stools You will need to be  at home and close to a bathroom for about 8 hours when you do the above as you may need to go to the bathroom frequently.   After you are cleaned out: - Start Colace100mg  twice daily - Start Miralax once daily - Start a daily fiber supplement like metamucil or citrucel - You can safely use enemas in pregnancy  - if you are having diarrhea you can reduce to Colace once a day or miralax every other day or a 1/2 capful daily.        Contraception Choices Contraception, also called birth control, refers to methods or devices that prevent pregnancy. Hormonal methods Contraceptive implant A contraceptive implant is a thin, plastic tube that contains a hormone that prevents pregnancy. It is different from an intrauterine device (IUD). It is inserted into the upper part of the arm by a health care provider. Implants can be effective for up to 3 years. Progestin-only injections Progestin-only injections are injections of progestin, a synthetic form of the hormone progesterone. They are given every 3 months by a health care provider. Birth control pills Birth control pills are pills that contain hormones that prevent pregnancy. They must be taken once a day, preferably at the same time each day. A prescription is needed to use this method of contraception. Birth control patch The birth control patch contains hormones that prevent pregnancy. It is placed on the skin and must be changed once a week for three weeks and removed on the fourth week. A prescription is needed to use this method of contraception. Vaginal ring A vaginal ring contains hormones that prevent pregnancy. It is placed in the vagina for three weeks and removed on the fourth week. After that, the process is repeated with a new ring. A prescription is needed to use this method of contraception. Emergency contraceptive Emergency contraceptives prevent pregnancy after unprotected sex. They come in pill form and can be taken up to 5  days after sex. They work best the sooner they are taken after having sex. Most emergency contraceptives are available without a prescription. This method should not be used as your only form of birth control. Barrier methods Female condom A female condom is a thin sheath that is worn over the penis during sex. Condoms keep sperm from going inside a woman's body. They can be used with a sperm-killing substance (spermicide) to increase their effectiveness. They should be thrown away after one use. Female condom A female condom is a soft, loose-fitting sheath that is put into the vagina before sex. The condom keeps sperm from going inside a woman's body. They should be thrown away after one use. Diaphragm A diaphragm is a soft, dome-shaped barrier. It is inserted into the vagina before sex, along with a spermicide. The diaphragm blocks sperm from entering the uterus, and the spermicide kills sperm. A diaphragm should be left in the vagina for 6-8 hours after sex and removed within 24 hours. A diaphragm is prescribed and fitted by a health care provider. A diaphragm should be replaced every 1-2 years, after giving birth, after gaining more than  15 lb (6.8 kg), and after pelvic surgery. Cervical cap A cervical cap is a round, soft latex or plastic cup that fits over the cervix. It is inserted into the vagina before sex, along with spermicide. It blocks sperm from entering the uterus. The cap should be left in place for 6-8 hours after sex and removed within 48 hours. A cervical cap must be prescribed and fitted by a health care provider. It should be replaced every 2 years. Sponge A sponge is a soft, circular piece of polyurethane foam with spermicide in it. The sponge helps block sperm from entering the uterus, and the spermicide kills sperm. To use it, you make it wet and then insert it into the vagina. It should be inserted before sex, left in for at least 6 hours after sex, and removed and thrown away within  30 hours. Spermicides Spermicides are chemicals that kill or block sperm from entering the cervix and uterus. They can come as a cream, jelly, suppository, foam, or tablet. A spermicide should be inserted into the vagina with an applicator at least 10-15 minutes before sex to allow time for it to work. The process must be repeated every time you have sex. Spermicides do not require a prescription. Intrauterine contraception Intrauterine device (IUD) An IUD is a T-shaped device that is put in a woman's uterus. There are two types: Hormone IUD.This type contains progestin, a synthetic form of the hormone progesterone. This type can stay in place for 3-5 years. Copper IUD.This type is wrapped in copper wire. It can stay in place for 10 years. Permanent methods of contraception Female tubal ligation In this method, a woman's fallopian tubes are sealed, tied, or blocked during surgery to prevent eggs from traveling to the uterus. Hysteroscopic sterilization In this method, a small, flexible insert is placed into each fallopian tube. The inserts cause scar tissue to form in the fallopian tubes and block them, so sperm cannot reach an egg. The procedure takes about 3 months to be effective. Another form of birth control must be used during those 3 months. Female sterilization This is a procedure to tie off the tubes that carry sperm (vasectomy). After the procedure, the man can still ejaculate fluid (semen). Another form of birth control must be used for 3 months after the procedure. Natural planning methods Natural family planning In this method, a couple does not have sex on days when the woman could become pregnant. Calendar method In this method, the woman keeps track of the length of each menstrual cycle, identifies the days when pregnancy can happen, and does not have sex on those days. Ovulation method In this method, a couple avoids sex during ovulation. Symptothermal method This method  involves not having sex during ovulation. The woman typically checks for ovulation by watching changes in her temperature and in the consistency of cervical mucus. Post-ovulation method In this method, a couple waits to have sex until after ovulation. Where to find more information Centers for Disease Control and Prevention: FootballExhibition.com.br Summary Contraception, also called birth control, refers to methods or devices that prevent pregnancy. Hormonal methods of contraception include implants, injections, pills, patches, vaginal rings, and emergency contraceptives. Barrier methods of contraception can include female condoms, female condoms, diaphragms, cervical caps, sponges, and spermicides. There are two types of IUDs (intrauterine devices). An IUD can be put in a woman's uterus to prevent pregnancy for 3-5 years. Permanent sterilization can be done through a procedure for males and females. Natural family planning  methods involve nothaving sex on days when the woman could become pregnant. This information is not intended to replace advice given to you by your health care provider. Make sure you discuss any questions you have with your health care provider. Document Revised: 04/24/2020 Document Reviewed: 04/24/2020 Elsevier Patient Education  2022 Elsevier Inc.       AREA PEDIATRIC/FAMILY PRACTICE PHYSICIANS  [facility] [address] [place], [place] [address] Phone - [phone]   Fax - [phone]  JACK AMOS [address] [place], [place]  [address] Phone - [phone]   Fax - [phone]  [facility] [address] [place], [place]  [address] Phone - [phone]   Fax - [phone]  [facility] [address] [place], [place]  [address] Phone - [phone]   Fax - [phone]  [facility] [address] [place], [place]  [address] Phone - [phone]   Fax -  [phone]  [facility] [address] [place], [place]  [address] Phone - [phone]   Fax - [phone]  [facility]  [address], [address] [place], [place]  [address] Phone - [phone]   Fax - [phone]  [facility] AT [facility] [address] [place], [address] [place], [place]  [address] Phone - [phone]   Fax - [phone]  [facility] AT [facility] [address] [place], [place]  [address] Phone - [phone]   Fax - [phone] [facility] AT [place] [address] [place], [place]  [address] Phone - [phone]   Fax - [phone]  [facility] [address] [place], [place]  [address] Phone - [phone]   Fax - [phone]  [facility] [address], [address] [place], [place]  [address] Phone - [phone]   Fax - [phone]  [facility] [place] [address] [place], [place]  [address] Phone - [phone]   Fax - [phone]  [facility] [address] [place], [place]  [address] Phone - [phone]  [facility] [address] [place], [place]  [address] Phone - [phone]   Fax - [phone]  [facility] [address] [place], [place]  [address] Phone - [phone]   Fax - [phone]  [facility] [address] [place], [place]  [address] Phone - [phone]   Fax - [phone]  [facility] [address] [place], [place]  [address] Phone - [phone]   Fax - [phone]   [facility] [address] [place], [place]  [address] Phone - [phone]   Fax - [phone]  [facility] [address] [place], [place]  [address] Phone - [phone]   Fax - [phone]  DAVID RUBIN [address] [place], [place]  [address] Phone - [phone]   Fax - [phone]  [facility] [address] [place], [place]  [address] Phone - [phone]   Fax - [phone]  [facility] Al[facility] Ch[facility] [address] [place], [place]  [address] Phone - [phone]   Fax - [phone] Gerarda Fraction [address] [place], [place]  [address] Phone - [phone]   Fax - [phone]  [facility] [address] [place], [place]  [address] Phone - [phone]   Fax -  [phone]  [facility] [place] [address] [place], [place]  [address] Phone - [phone]   Fax - [phone]        Childbirth Education Options: [facility] Classes:  Childbirth education classes can help you get ready for a positive parenting experience. You can also meet other expectant parents and get free stuff for your baby. Each class runs for five weeks on the same night and costs $45 for the mother-to-be and her support person. Medicaid covers the cost if you are eligible. Call [phone] to register. [facility] Childbirth Education: Classes can vary in availability and schedule is subject to change. For most up-to-date information please visit [email or web address] to review and register.

## 2021-09-06 NOTE — Progress Notes (Signed)
Patient is currently pregnant and is a patient of record with [facility] Donia Ast, NP).  Will close to PAP f/u. Hart Carwin, RN

## 2021-09-10 ENCOUNTER — Telehealth: Payer: Self-pay | Admitting: *Deleted

## 2021-09-10 NOTE — Telephone Encounter (Signed)
Returned TC to patient. Reports nasal congestion, body aches, headache and intermittent fever. Denies SOB. Denies urinary symptoms. Reports FM. Advised patient to take at home Covid test and if positive isolate from family in the home. Advised patient to go to [facility] ER if she becomes SOB. Advised patient to go to MAU if she experiences signs of preterm labor, vaginal bleeding, or other OB complaint. Patient has pregnancy safe med list. Encouraged Tylenol for fever, body aches and headache and decongestant and Afrin for nasal congestion. Encouraged hydration. Patient verbalized understanding.

## 2021-09-19 ENCOUNTER — Encounter: Payer: Self-pay | Admitting: Women's Health

## 2021-09-20 ENCOUNTER — Ambulatory Visit (INDEPENDENT_AMBULATORY_CARE_PROVIDER_SITE_OTHER): Payer: Self-pay | Admitting: Nurse Practitioner

## 2021-09-20 ENCOUNTER — Other Ambulatory Visit: Payer: Self-pay

## 2021-09-20 ENCOUNTER — Encounter: Payer: Self-pay | Admitting: Nurse Practitioner

## 2021-09-20 VITALS — BP 104/69 | HR 77 | Wt 181.6 lb

## 2021-09-20 DIAGNOSIS — O98512 Other viral diseases complicating pregnancy, second trimester: Secondary | ICD-10-CM

## 2021-09-20 DIAGNOSIS — Z3480 Encounter for supervision of other normal pregnancy, unspecified trimester: Secondary | ICD-10-CM

## 2021-09-20 DIAGNOSIS — Z3A24 24 weeks gestation of pregnancy: Secondary | ICD-10-CM

## 2021-09-20 DIAGNOSIS — U071 COVID-19: Secondary | ICD-10-CM | POA: Insufficient documentation

## 2021-09-20 NOTE — Progress Notes (Signed)
ROB 24.6 wks Positive Covid last week Fever last Friday and Saturday, had episode of low abdomen pain/ pressure then. States fetal movement has been different since then. 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

## 2021-09-20 NOTE — Progress Notes (Signed)
    Subjective:  Madison Hood is a 30 y.o. G4P1021 at [redacted]w[redacted]d being seen today for ongoing prenatal care.  She is currently monitored for the following issues for this low-risk pregnancy and has Major depressive disorder, single episode, mild (HCC); Anxiety; Abnormal Pap smear of cervix  09/06/20 neg HPV +; Supervision of other normal pregnancy, antepartum; Constipation during pregnancy in second trimester; and COVID-19 affecting pregnancy in second trimester on their problem list.  Patient reports no complaints.  Contractions: Irritability.  .  Movement: Present. Denies leaking of fluid.   The following portions of the patient's history were reviewed and updated as appropriate: allergies, current medications, past family history, past medical history, past social history, past surgical history and problem list. Problem list updated.  Objective:   Vitals:   09/20/21 1139  BP: 104/69  Pulse: 77  Weight: 181 lb 9.6 oz (82.4 kg)    Fetal Status: Fetal Heart Rate (bpm): 156   Movement: Present     General:  Alert, oriented and cooperative. Patient is in no acute distress.  Skin: Skin is warm and dry. No rash noted.   Cardiovascular: Normal heart rate noted  Respiratory: Normal respiratory effort, no problems with respiration noted  Abdomen: Soft, gravid, appropriate for gestational age. Pain/Pressure: Absent     Pelvic:  Cervical exam deferred        Extremities: Normal range of motion.  Edema: Trace  Mental Status: Normal mood and affect. Normal behavior. Normal judgment and thought content.   Urinalysis:      Assessment and Plan:  Pregnancy: G4P1021 at [redacted]w[redacted]d  1. Supervision of other normal pregnancy, antepartum Reviewed next visit to come for fasting glucola  Reviewed [facility]AP [facility] and letter to be given at checkout Was worried about using Tylenol while she had Covid - saw an ad on TV about link between Autism and tylenol.  Advised it was  important to care for herself when she is ill and no proven link that has come through medical channels on this association of tylenol and autism.  2. COVID-19 affecting pregnancy in second trimester Was one week ago when symptoms began - symptoms lasted 5 days  3. [redacted] weeks gestation of pregnancy   Preterm labor symptoms and general obstetric precautions including but not limited to vaginal bleeding, contractions, leaking of fluid and fetal movement were reviewed in detail with the patient. Please refer to After Visit Summary for other counseling recommendations.  Return in about 4 weeks (around 10/18/2021) for early AM for glucola and ROB.  Nolene Bernheim, RN, MSN, NP-BC Nurse Practitioner, [facility] for [employer], [facility] 09/20/2021 12:03 PM

## 2021-10-09 ENCOUNTER — Inpatient Hospital Stay (HOSPITAL_COMMUNITY)
Admission: AD | Admit: 2021-10-09 | Discharge: 2021-10-09 | Disposition: A | Discharge status: Home / Self Care | Payer: Self-pay | Attending: Obstetrics and Gynecology | Admitting: Obstetrics and Gynecology

## 2021-10-09 ENCOUNTER — Encounter (HOSPITAL_COMMUNITY): Payer: Self-pay | Admitting: Obstetrics and Gynecology

## 2021-10-09 ENCOUNTER — Other Ambulatory Visit: Payer: Self-pay | Admitting: *Deleted

## 2021-10-09 ENCOUNTER — Telehealth: Payer: Self-pay | Admitting: *Deleted

## 2021-10-09 ENCOUNTER — Other Ambulatory Visit: Payer: Self-pay

## 2021-10-09 DIAGNOSIS — O26893 Other specified pregnancy related conditions, third trimester: Secondary | ICD-10-CM | POA: Insufficient documentation

## 2021-10-09 DIAGNOSIS — O1203 Gestational edema, third trimester: Secondary | ICD-10-CM

## 2021-10-09 DIAGNOSIS — R609 Edema, unspecified: Secondary | ICD-10-CM | POA: Insufficient documentation

## 2021-10-09 DIAGNOSIS — F320 Major depressive disorder, single episode, mild: Secondary | ICD-10-CM

## 2021-10-09 DIAGNOSIS — Z3A28 28 weeks gestation of pregnancy: Secondary | ICD-10-CM | POA: Insufficient documentation

## 2021-10-09 LAB — CBC
HCT: 30.6 % — ABNORMAL LOW (ref 36.0–46.0)
Hemoglobin: 10.4 g/dL — ABNORMAL LOW (ref 12.0–15.0)
MCH: 31.5 pg (ref 26.0–34.0)
MCHC: 34 g/dL (ref 30.0–36.0)
MCV: 92.7 fL (ref 80.0–100.0)
Platelets: 236 10*3/uL (ref 150–400)
RBC: 3.3 MIL/uL — ABNORMAL LOW (ref 3.87–5.11)
RDW: 12.2 % (ref 11.5–15.5)
WBC: 9.1 10*3/uL (ref 4.0–10.5)
nRBC: 0 % (ref 0.0–0.2)

## 2021-10-09 LAB — COMPREHENSIVE METABOLIC PANEL
ALT: 13 U/L (ref 0–44)
AST: 20 U/L (ref 15–41)
Albumin: 2.4 g/dL — ABNORMAL LOW (ref 3.5–5.0)
Alkaline Phosphatase: 67 U/L (ref 38–126)
Anion gap: 8 (ref 5–15)
BUN: 9 mg/dL (ref 6–20)
CO2: 21 mmol/L — ABNORMAL LOW (ref 22–32)
Calcium: 8.2 mg/dL — ABNORMAL LOW (ref 8.9–10.3)
Chloride: 108 mmol/L (ref 98–111)
Creatinine, Ser: 0.65 mg/dL (ref 0.44–1.00)
GFR, Estimated: >60 mL/min (ref >60)
Glucose, Bld: 100 mg/dL — ABNORMAL HIGH (ref 70–99)
Potassium: 3.7 mmol/L (ref 3.5–5.1)
Sodium: 137 mmol/L (ref 135–145)
Total Bilirubin: 0.6 mg/dL (ref 0.3–1.2)
Total Protein: 5.3 g/dL — ABNORMAL LOW (ref 6.5–8.1)

## 2021-10-09 LAB — PROTEIN / CREATININE RATIO, URINE
Creatinine, Urine: 167.2 mg/dL
Protein Creatinine Ratio: 0.07 mg/mg{Cre} (ref 0.00–0.15)
Total Protein, Urine: 12 mg/dL

## 2021-10-09 NOTE — MAU Note (Signed)
Talked to her nurse, was told to come here.  Face, feet and hands are swollen. Has not checked BP. Denies HA, visual changes or epigastric pain. Denies vag bleeding or d/c. +FM. Pain in lower back, started 3 days ago, when she stands for a long period of time.

## 2021-10-09 NOTE — Telephone Encounter (Signed)
Returned TC to patient regarding report of swelling of face, feet, and hands. Patient advised to seek evaluation in MAU for BP check and assessment of symptoms by provider. Explained that concern is for upper extremity and face swelling and possible BP elevation/ pre-eclampsia. Patient verbalized understanding. Patient is aware of location of MAU.

## 2021-10-09 NOTE — MAU Provider Note (Signed)
History     CSN: [id number]  Arrival date and time: 10/09/21 1418   None     Chief Complaint  Patient presents with   Facial Swelling   Back Pain   Foot Swelling   swollen hands   HPI Madison Hood is a 30 y.o. G4P1021 at [redacted]w[redacted]d here for swelling in feet, hands, and face. Patient reports swelling started several weeks ago, however for the past 2 weeks she has noticed a significant increase. Reports that the swelling is generally worse in the morning. She denies any elevated BP's, headaches, vision changes, or RUQ/epigastric pain. She reports that she feels as if she is eating and drinking a normal amount, eats plenty of fruits, vegetables, and water. Has been eating out more recently, but does not add more salt to her food.   OB History     Gravida  4   Para  1   Term  1   Preterm  0   AB  2   Living  1      SAB  2   IAB  0   Ectopic  0   Multiple  0   Live Births  1           Past Medical History:  Diagnosis Date   History of anemia    History of elective abortion    History of spontaneous abortion     Past Surgical History:  Procedure Laterality Date   denies surgical history      Family History  Problem Relation Age of Onset   Hypertension Mother    Allergies Mother    Migraines Sister    Migraines Sister    Cancer Maternal Grandmother     Social History   Tobacco Use   Smoking status: Never   Smokeless tobacco: Never  Vaping Use   Vaping Use: Never used  Substance Use Topics   Alcohol use: Not Currently    Alcohol/week: 1.0 - 5.0 standard drink    Types: 1 Glasses of wine per week    Comment:  Last use one glass of wine 2 days ago.   Drug use: Not Currently    Types: Marijuana    Comment: stopped in May 2022    Allergies: No Known Allergies  No medications prior to admission.    Review of Systems  Constitutional: Negative.   Respiratory: Negative.    Cardiovascular:        Swelling in hands, feet, and face   Gastrointestinal: Negative.   Genitourinary: Negative.   Musculoskeletal: Negative.   Neurological: Negative.   Psychiatric/Behavioral: Negative.    Physical Exam   Blood pressure 108/69, pulse 67, temperature 98 F (36.7 C), temperature source Oral, resp. rate 16, height 5\' 7"  (1.702 m), weight 89.5 kg, last menstrual period 03/30/2021, SpO2 100 %.  Physical Exam Constitutional:      General: She is not in acute distress. HENT:     Head: Normocephalic and atraumatic.  Eyes:     Pupils: Pupils are equal, round, and reactive to light.  Cardiovascular:     Rate and Rhythm: Normal rate.  Pulmonary:     Effort: Pulmonary effort is normal.  Abdominal:     Palpations: Abdomen is soft.     Tenderness: There is no abdominal tenderness.  Musculoskeletal:        General: Swelling present. No tenderness. Normal range of motion.     Cervical back: Normal range of motion.  Comments: +1 pitting edema bilateral ankles and feet  Skin:    General: Skin is warm and dry.  Neurological:     General: No focal deficit present.     Mental Status: She is alert and oriented to person, place, and time.  Psychiatric:        Mood and Affect: Mood normal.        Behavior: Behavior normal.        Thought Content: Thought content normal.        Judgment: Judgment normal.   FHT: 145 bpm, moderate variability, +10x10 accels, no decels Toco: quiet  MAU Course  Procedures  MDM CBC, CMP, UPCR mostly wnl. Protein and total albumin low.  Serial BP's, all normotensive  NST reactive and reassuring for gestational age  Assessment and Plan  [redacted] weeks gestation of pregnancy Swelling   - Discharged home in stable condition - Strict return precautions reviewed - Encouraged patient to increase protein and limit sodium - Follow up at [facility] as scheduled on 11/17. Return to MAU as needed    Brand Males, CNM 10/09/2021, 5:24 PM

## 2021-10-18 ENCOUNTER — Ambulatory Visit (INDEPENDENT_AMBULATORY_CARE_PROVIDER_SITE_OTHER): Payer: Self-pay

## 2021-10-18 ENCOUNTER — Other Ambulatory Visit: Payer: Self-pay

## 2021-10-18 VITALS — BP 123/83 | HR 67 | Wt 197.0 lb

## 2021-10-18 DIAGNOSIS — Z3480 Encounter for supervision of other normal pregnancy, unspecified trimester: Secondary | ICD-10-CM

## 2021-10-18 DIAGNOSIS — Z3A28 28 weeks gestation of pregnancy: Secondary | ICD-10-CM

## 2021-10-18 DIAGNOSIS — O1203 Gestational edema, third trimester: Secondary | ICD-10-CM

## 2021-10-18 NOTE — Progress Notes (Signed)
LOW-RISK PREGNANCY OFFICE VISIT  Patient name: Madison Hood MRN [id number]  Date of birth: 1991/10/19 Chief Complaint:   Routine Prenatal Visit  Subjective:   Madison Hood is a 30 y.o. G40P1021 female at [redacted]w[redacted]d with an Estimated Date of Delivery: 01/04/22 being seen today for ongoing management of a low-risk pregnancy aeb has Major depressive disorder, single episode, mild (HCC); Anxiety; Abnormal Pap smear of cervix  09/06/20 neg HPV +; Supervision of other normal pregnancy, antepartum; Constipation during pregnancy in second trimester; and COVID-19 affecting pregnancy in second trimester on their problem list.  Patient presents today with  concern regarding swelling of face, hands, and feet .  She reports she was evaluated at the hospital and reassured that it was of no concern.  She admits that the swelling has improved, but continues.  Patient reports she eats fast food at a least once a day 5x/week.  She reports drinking 5 bottles of water, but admits that this varies.     Patient endorses fetal movement. Patient denies abdominal cramping or contractions.  Patient denies vaginal concerns including abnormal discharge, leaking of fluid, and bleeding.  Contractions: Irritability. Vag. Bleeding: None.  Movement: Present.  Patient also reports she was instructed to obtain her Tdap, but when she went at 24 weeks the nurse informed her it was too early. Patient questions why she was instructed to get vaccination if it was too early.  Patient also report she had a influenza vaccine and had some body aches, HA, and fatigue yesterday.  She also reports mild insomnia in which she feels that vivid dreams and feeling like her thoughts "just keep going" are main contributors.  She denies current anxiety.   Reviewed past medical,surgical, social, obstetrical and family history as well as problem list, medications and allergies.  Objective   Vitals:   10/18/21 1016  BP: 123/83  Pulse: 67   Weight: 197 lb (89.4 kg)  Body mass index is 30.85 kg/m.  Total Weight Gain:19 lb (8.618 kg)         Physical Examination:   General appearance: Well appearing, and in no distress  Mental status: Alert, oriented to person, place, and time  Skin: Warm & dry  Cardiovascular: Normal heart rate noted  Respiratory: Normal respiratory effort, no distress  Abdomen: Soft, gravid, nontender, AGA with Fundal Height: 29 cm  Pelvic: Cervical exam deferred           Extremities: Edema: None  Fetal Status: Fetal Heart Rate (bpm): 148  Movement: Present   No results found for this or any previous visit (from the past 24 hour(s)).  Assessment & Plan:  Low-risk pregnancy of a 30 y.o., A6T0160 at [redacted]w[redacted]d with an Estimated Date of Delivery: 01/04/22   1. Supervision of other normal pregnancy, antepartum -Anticipatory guidance for upcoming appts. -Patient to schedule next appt in 2 weeks for an in-person visit. -GTT completed today.  -Reviewed blood draw procedures and labs which also include check of iron/HgB level, RPR, and HIV *Informed that repeat RPR/HIV are for pediatric records/compliance.  -Discussed how results of GTT are handled including diabetic education and BS testing for abnormal results and routine care for normal results.    2. [redacted] weeks gestation of pregnancy -Reviewed complaints and concerns. -Informed that it is recommended that women receive Tdap vaccine in pregnancy and that is why recommendation was made. -Informed that provider is not sure why she was told to get her Tdap at 24 week -Provider also clarifies that patient  made said appt for Tdap and it was not made for her.  -Discussed c/o flu-like symptoms.  Informed that flu vaccine could of been contributor as well as known community viruses/infections that are current prevalent. -Discussed mild insomnia and feelings of fatigue.  Reviewed how anxiety can contribute to feelings of "racing thoughts."  Patient encouraged to  utilize sleep routine and try not to eat at least 2 hours before bedtime.    3. Edema in pregnancy in third trimester -Reassured that swelling is normal in pregnancy. -Reviewed contributors including pregnancy, eating habits, and water consumption. -Discussed implementing light exercise routine such as walking 30 minutes day. -Reviewed modifications in diet as well as increase in hydration intake.  -Discussed usage of compression stockings. -Instructed to monitor swelling and report any worsening especially in conjunction with new symptoms.   Meds: No orders of the defined types were placed in this encounter.  Labs/procedures today:  Lab Orders  No laboratory test(s) ordered today     Reviewed: Preterm labor symptoms and general obstetric precautions including but not limited to vaginal bleeding, contractions, leaking of fluid and fetal movement were reviewed in detail with the patient.  All questions were answered.  Follow-up: No follow-ups on file.  No orders of the defined types were placed in this encounter.  Cherre Robins MSN, CNM 10/18/2021

## 2021-10-19 LAB — CBC
Hematocrit: 32.3 % — ABNORMAL LOW (ref 34.0–46.6)
Hemoglobin: 11.2 g/dL (ref 11.1–15.9)
MCH: 31 pg (ref 26.6–33.0)
MCHC: 34.7 g/dL (ref 31.5–35.7)
MCV: 90 fL (ref 79–97)
Platelets: 246 10*3/uL (ref 150–450)
RBC: 3.61 x10E6/uL — ABNORMAL LOW (ref 3.77–5.28)
RDW: 11.7 % (ref 11.7–15.4)
WBC: 9.3 10*3/uL (ref 3.4–10.8)

## 2021-10-19 LAB — GLUCOSE TOLERANCE, 2 HOURS W/ 1HR
Glucose, 1 hour: 101 mg/dL (ref 70–179)
Glucose, 2 hour: 73 mg/dL (ref 70–152)
Glucose, Fasting: 71 mg/dL (ref 70–91)

## 2021-10-19 LAB — RPR: RPR Ser Ql: NONREACTIVE

## 2021-10-19 LAB — HIV ANTIBODY (ROUTINE TESTING W REFLEX): HIV Screen 4th Generation wRfx: NONREACTIVE

## 2021-10-31 ENCOUNTER — Other Ambulatory Visit: Payer: Self-pay

## 2021-10-31 ENCOUNTER — Ambulatory Visit (INDEPENDENT_AMBULATORY_CARE_PROVIDER_SITE_OTHER): Payer: Self-pay | Admitting: Women's Health

## 2021-10-31 VITALS — BP 128/77 | HR 75 | Wt 200.0 lb

## 2021-10-31 DIAGNOSIS — Z3A30 30 weeks gestation of pregnancy: Secondary | ICD-10-CM

## 2021-10-31 DIAGNOSIS — Z3480 Encounter for supervision of other normal pregnancy, unspecified trimester: Secondary | ICD-10-CM

## 2021-10-31 DIAGNOSIS — F320 Major depressive disorder, single episode, mild: Secondary | ICD-10-CM

## 2021-10-31 DIAGNOSIS — F419 Anxiety disorder, unspecified: Secondary | ICD-10-CM

## 2021-10-31 NOTE — Progress Notes (Signed)
Subjective:  Madison Hood is a 30 y.o. G4P1021 at [redacted]w[redacted]d being seen today for ongoing prenatal care.  She is currently monitored for the following issues for this low-risk pregnancy and has Major depressive disorder, single episode, mild (HCC); Anxiety; Abnormal Pap smear of cervix  09/06/20 neg HPV +; Supervision of other normal pregnancy, antepartum; Constipation during pregnancy in second trimester; and COVID-19 affecting pregnancy in second trimester on their problem list.  Patient reports no complaints.  Contractions: Not present. Vag. Bleeding: None.  Movement: Present. Denies leaking of fluid.   The following portions of the patient's history were reviewed and updated as appropriate: allergies, current medications, past family history, past medical history, past social history, past surgical history and problem list. Problem list updated.  Objective:   Vitals:   10/31/21 1617  BP: 128/77  Pulse: 75  Weight: 200 lb (90.7 kg)    Fetal Status: Fetal Heart Rate (bpm): 157   Movement: Present     General:  Alert, oriented and cooperative. Patient is in no acute distress.  Skin: Skin is warm and dry. No rash noted.   Cardiovascular: Normal heart rate noted  Respiratory: Normal respiratory effort, no problems with respiration noted  Abdomen: Soft, gravid, appropriate for gestational age. Pain/Pressure: Absent     Pelvic: Vag. Bleeding: None     Cervical exam deferred        Extremities: Normal range of motion.  Edema: None  Mental Status: Normal mood and affect. Normal behavior. Normal judgment and thought content.   Urinalysis:      Assessment and Plan:  Pregnancy: G4P1021 at [redacted]w[redacted]d  1. Supervision of other normal pregnancy, antepartum -reports difficulty sleeping, discussed sleep hygiene, pt advised to keep sleep journal, will f/u next visit  2. Anxiety GAD 7 : Generalized Anxiety Score 10/31/2021  Nervous, Anxious, on Edge 0  Control/stop worrying 0  Worry too much -  different things 0  Trouble relaxing 1  Restless 0  Easily annoyed or irritable 1  Afraid - awful might happen 0  Total GAD 7 Score 2  Anxiety Difficulty Not difficult at all    3. Major depressive disorder, single episode, mild (HCC) Depression screen [facility] 2/9 10/31/2021 06/13/2021 09/06/2020  Decreased Interest 1 3 0  Down, Depressed, Hopeless 0 3 0  PHQ - 2 Score 1 6 0  Altered sleeping 1 3 -  Tired, decreased energy 1 3 -  Change in appetite 1 3 -  Feeling bad or failure about yourself  0 3 -  Trouble concentrating 1 3 -  Moving slowly or fidgety/restless 0 3 -  Suicidal thoughts 0 1 -  PHQ-9 Score 5 25 -  Difficult doing work/chores Not difficult at all Extremely dIfficult -   4. [redacted] weeks gestation of pregnancy  Preterm labor symptoms and general obstetric precautions including but not limited to vaginal bleeding, contractions, leaking of fluid and fetal movement were reviewed in detail with the patient. I discussed the assessment and treatment plan with the patient. The patient was provided an opportunity to ask questions and all were answered. The patient agreed with the plan and demonstrated an understanding of the instructions. The patient was advised to call back or seek an in-person office evaluation/go to [facility] at [facility] for any urgent or concerning symptoms. Please refer to After Visit Summary for other counseling recommendations.  Return in about 2 weeks (around 11/14/2021) for in-person LOB/APP OK.   Brandon Scarbrough, Odie Sera, NP

## 2021-11-14 ENCOUNTER — Ambulatory Visit (INDEPENDENT_AMBULATORY_CARE_PROVIDER_SITE_OTHER): Payer: Self-pay | Admitting: Women's Health

## 2021-11-14 ENCOUNTER — Other Ambulatory Visit: Payer: Self-pay

## 2021-11-14 VITALS — BP 129/88 | HR 64 | Wt 204.0 lb

## 2021-11-14 DIAGNOSIS — F419 Anxiety disorder, unspecified: Secondary | ICD-10-CM

## 2021-11-14 DIAGNOSIS — F320 Major depressive disorder, single episode, mild: Secondary | ICD-10-CM

## 2021-11-14 DIAGNOSIS — Z3480 Encounter for supervision of other normal pregnancy, unspecified trimester: Secondary | ICD-10-CM

## 2021-11-14 DIAGNOSIS — Z3A32 32 weeks gestation of pregnancy: Secondary | ICD-10-CM

## 2021-11-14 DIAGNOSIS — R609 Edema, unspecified: Secondary | ICD-10-CM

## 2021-11-14 NOTE — Progress Notes (Signed)
Pt states she is swelling all over including legs, arms, feet, face.

## 2021-11-14 NOTE — Progress Notes (Signed)
Subjective:  Madison Hood is a 30 y.o. G4P1021 at 96w5dbeing seen today for ongoing prenatal care.  She is currently monitored for the following issues for this low-risk pregnancy and has Major depressive disorder, [place] episode, [place]; Anxiety; Abnormal Pap smear of cervix  09/06/20 neg HPV +; Supervision of other normal pregnancy, antepartum; Constipation during pregnancy in second trimester; COVID-19 affecting pregnancy in second trimester; and Mild pre-eclampsia on their problem list.  Patient reports  swelling of legs, hands, face .  Contractions: Not present. Vag. Bleeding: None.  Movement: Present. Denies leaking of fluid.   The following portions of the patient's history were reviewed and updated as appropriate: allergies, current medications, past family history, past medical history, past social history, past surgical history and problem list. Problem list updated.  Objective:   Vitals:   11/14/21 0844  BP: 129/88  Pulse: 64  Weight: 204 lb (92.5 kg)    Fetal Status: Fetal Heart Rate (bpm): 145   Movement: Present     General:  Alert, oriented and cooperative. Patient is in no acute distress.  Skin: Skin is warm and dry. No rash noted.   Cardiovascular: Normal heart rate noted  Respiratory: Normal respiratory effort, no problems with respiration noted  Abdomen: Soft, gravid, appropriate for gestational age. Pain/Pressure: Absent     Pelvic: Vag. Bleeding: None     Cervical exam deferred        Extremities: Normal range of motion.  Edema: Deep pitting, indentation remains for a short time  Mental Status: Normal mood and affect. Normal behavior. Normal judgment and thought content.   Urinalysis:      Assessment and Plan:  Pregnancy: G4P1021 at 352w5d1. Swelling - Comp Met (CMET) - CBC - Protein / creatinine ratio, urine -BP check Friday with manual cuff inflation per conversation with Dr. DuDamita Dunnings2. Supervision of other normal pregnancy, antepartum  3.  Anxiety  GAD 7 : Generalized Anxiety Score 10/31/2021  Nervous, Anxious, on Edge 0  Control/stop worrying 0  Worry too much - different things 0  Trouble relaxing 1  Restless 0  Easily annoyed or irritable 1  Afraid - awful might happen 0  Total GAD 7 Score 2  Anxiety Difficulty Not difficult at all   4. Major depressive disorder, single episode, mild (HCC)  PHQ9 SCORE ONLY 10/31/2021 06/13/2021 09/06/2020  PHQ-9 Total Score 5 25 0   5. [redacted] weeks gestation of pregnancy  Preterm labor symptoms and general obstetric precautions including but not limited to vaginal bleeding, contractions, leaking of fluid and fetal movement were reviewed in detail with the patient. I discussed the assessment and treatment plan with the patient. The patient was provided an opportunity to ask questions and all were answered. The patient agreed with the plan and demonstrated an understanding of the instructions. The patient was advised to call back or seek an in-person office evaluation[facility]. Please refer to After Visit Summary for other counseling recommendations.  Return in about 2 weeks (around 11/28/2021) for in-person LOB/APP OK, also needs BP check Friday this week.   Sanvika Cuttino, NiGerrie NordmannNP

## 2021-11-14 NOTE — Patient Instructions (Signed)
Maternity Assessment Unit (MAU)  The Maternity Assessment Unit (MAU) is located at the [facility] and [facility] at [facility]. The address is: [address], [place], [place], [place] [address]. Please see map below for additional directions.    The Maternity Assessment Unit is designed to help you during your pregnancy, and for up to 6 weeks after delivery, with any pregnancy- or postpartum-related emergencies, if you think you are in labor, or if your water has broken. For example, if you experience nausea and vomiting, vaginal bleeding, severe abdominal or pelvic pain, elevated blood pressure or other problems related to your pregnancy or postpartum time, please come to the Maternity Assessment Unit for assistance.       Preeclampsia and Eclampsia Preeclampsia is a serious condition that may develop during pregnancy. This condition involves high blood pressure during pregnancy and causes symptoms such as headaches, vision changes, and increased swelling in the legs, hands, and face. Preeclampsia occurs after 20 weeks of pregnancy. Eclampsia is a seizure that happens from worsening preeclampsia. Diagnosing and managing preeclampsia early is important. If not treated early, it can cause serious problems for mother and baby. There is no cure for this condition. However, during pregnancy, delivering the baby may be the best treatment for preeclampsia or eclampsia. For most women, symptoms of preeclampsia and eclampsia go away after giving birth. In rare cases, a woman may develop preeclampsia or eclampsia after giving birth. This usually occurs within 48 hours after childbirth but may occur up to 6 weeks after giving birth. What are the causes? The cause of this condition is not known. What increases the risk? The following factors make you more likely to develop preeclampsia: Being pregnant for the first time or being pregnant with multiples. Having had preeclampsia or a  condition called hemolysis, elevated liver enzymes, and low platelet count (HELLP)syndrome during a past pregnancy. Having a family history of preeclampsia. Being older than age 62. Being obese. Becoming pregnant through fertility treatments. Conditions that reduce blood flow or oxygen to your placenta and baby may also increase your risk. These include: High blood pressure before, during, or immediately following pregnancy. Kidney disease. Diabetes. Blood clotting disorders. Autoimmune diseases, such as lupus. Sleep apnea. What are the signs or symptoms? Common symptoms of this condition include: A severe, throbbing headache that does not go away. Vision problems, such as blurred or double vision and light sensitivity. Pain in the stomach, especially the right upper region. Pain in the shoulder. Other symptoms that may develop as the condition gets worse include: Sudden weight gain because of fluid buildup in the body. This causes swelling of the face, hands, legs, and feet. Severe nausea and vomiting. Urinating less than usual. Shortness of breath. Seizures. How is this diagnosed? Your health care provider will ask you about symptoms and check for signs of preeclampsia during your prenatal visits. You will also have routine tests, including: Checking your blood pressure. Urine tests to check for protein. Blood tests to assess your organ function. Monitoring your baby's heart rate. Ultrasounds to check fetal growth. How is this treated? You and your health care provider will determine the treatment that is best for you. Treatment may include: Frequent prenatal visits to check for preeclampsia. Medicine to lower your blood pressure. Medicine to prevent seizures. Low-dose aspirin during your pregnancy. Staying in the hospital, in severe cases. You will be given medicines to control your blood pressure and the amount of fluids in your body. Delivering your baby.  Work with your  health care provider to manage any chronic health conditions, such as diabetes or kidney problems. Also, work with your health care provider to manage weight gain during pregnancy. Follow these instructions at home: Eating and drinking Drink enough fluid to keep your urine pale yellow. Avoid caffeine. Caffeine may increase blood pressure and heart rate and lead to dehydration. Reduce the amount of salt that you eat. Lifestyle Do not use any products that contain nicotine or tobacco. These products include cigarettes, chewing tobacco, and vaping devices, such as e-cigarettes. If you need help quitting, ask your health care provider. Do not use alcohol or drugs. Avoid stress as much as possible. Rest and get plenty of sleep. General instructions  Take over-the-counter and prescription medicines only as told by your health care provider. When lying down, lie on your left side. This keeps pressure off your major blood vessels. When sitting or lying down, raise (elevate) your feet. Try putting pillows underneath your lower legs. Exercise regularly. Ask your health care provider what kinds of exercise are best for you. Check your blood pressure as often as recommended by your health care provider. Keep all prenatal and follow-up visits. This is important. Contact a health care provider if: You have symptoms that may need treatment or closer monitoring. These include: Headaches. Stomach pain or nausea and vomiting. Shoulder pain. Vision problems, such as spots in front of your eyes or blurry vision. Sudden weight gain or increased swelling in your face, hands, legs, and feet. Increased anxiety or feeling of impending doom. Signs or symptoms of labor. Get help right away if: You have any of the following symptoms: A seizure. Shortness of breath or trouble breathing. Trouble speaking or slurred speech. Fainting. Chest pain. These symptoms may represent a serious problem that is an  emergency. Do not wait to see if the symptoms will go away. Get medical help right away. Call your local emergency services (911 in the [place] Do not drive yourself to the hospital. Summary Preeclampsia is a serious condition that may develop during pregnancy. Diagnosing and treating preeclampsia early is very important. Keep all prenatal and follow-up visits. This is important. Get help right away if you have a seizure, shortness of breath or trouble breathing, trouble speaking or slurred speech, chest pain, or fainting. This information is not intended to replace advice given to you by your health care provider. Make sure you discuss any questions you have with your health care provider. Document Revised: 08/10/2020 Document Reviewed: 08/10/2020 Elsevier Patient Education  2022 [employer].       Third Trimester of Pregnancy The third trimester of pregnancy is from week 28 through week 40. This is months 7 through 9. The third trimester is a time when the unborn baby (fetus) is growing rapidly. At the end of the ninth month, the fetus is about 20 inches long and weighs 6-10 pounds. Body changes during your third trimester During the third trimester, your body will continue to go through many changes. The changes vary and generally return to normal after your baby is born. Physical changes Your weight will continue to increase. You can expect to gain 25-35 pounds (11-16 kg) by the end of the pregnancy if you begin pregnancy at a normal weight. If you are underweight, you can expect to gain 28-40 lb (about 13-18 kg), and if you are overweight, you can expect to gain 15-25 lb (about 7-11 kg). You may begin to get stretch marks on your hips,  abdomen, and breasts. Your breasts will continue to grow and may hurt. A yellow fluid (colostrum) may leak from your breasts. This is the first milk you are producing for your baby. You may have changes in your hair. These can include thickening of your  hair, rapid growth, and changes in texture. Some people also have hair loss during or after pregnancy, or hair that feels dry or thin. Your belly button may stick out. You may notice more swelling in your hands, face, or ankles. Health changes You may have heartburn. You may have constipation. You may develop hemorrhoids. You may develop swollen, bulging veins (varicose veins) in your legs. You may have increased body aches in the pelvis, back, or thighs. This is due to weight gain and increased hormones that are relaxing your joints. You may have increased tingling or numbness in your hands, arms, and legs. The skin on your abdomen may also feel numb. You may feel short of breath because of your expanding uterus. Other changes You may urinate more often because the fetus is moving lower into your pelvis and pressing on your bladder. You may have more problems sleeping. This may be caused by the size of your abdomen, an increased need to urinate, and an increase in your body's metabolism. You may notice the fetus "dropping," or moving lower in your abdomen (lightening). You may have increased vaginal discharge. You may notice that you have pain around your pelvic bone as your uterus distends. Follow these instructions at home: Medicines Follow your health care provider's instructions regarding medicine use. Specific medicines may be either safe or unsafe to take during pregnancy. Do not take any medicines unless approved by your health care provider. Take a prenatal vitamin that contains at least 600 micrograms (mcg) of folic acid. Eating and drinking Eat a healthy diet that includes fresh fruits and vegetables, whole grains, good sources of protein such as meat, eggs, or tofu, and low-fat dairy products. Avoid raw meat and unpasteurized juice, milk, and cheese. These carry germs that can harm you and your baby. Eat 4 or 5 small meals rather than 3 large meals a day. You may need to take  these actions to prevent or treat constipation: Drink enough fluid to keep your urine pale yellow. Eat foods that are high in fiber, such as beans, whole grains, and fresh fruits and vegetables. Limit foods that are high in fat and processed sugars, such as fried or sweet foods. Activity Exercise only as directed by your health care provider. Most people can continue their usual exercise routine during pregnancy. Try to exercise for 30 minutes at least 5 days a week. Stop exercising if you experience contractions in the uterus. Stop exercising if you develop pain or cramping in the lower abdomen or lower back. Avoid heavy lifting. Do not exercise if it is very hot or humid or if you are at a high altitude. If you choose to, you may continue to have sex unless your health care provider tells you not to. Relieving pain and discomfort Take frequent breaks and rest with your legs raised (elevated) if you have leg cramps or low back pain. Take warm sitz baths to soothe any pain or discomfort caused by hemorrhoids. Use hemorrhoid cream if your health care provider approves. Wear a supportive bra to prevent discomfort from breast tenderness. If you develop varicose veins: Wear support hose as told by your health care provider. Elevate your feet for 15 minutes, 3-4 times a day. Limit  salt in your diet. Safety Talk to your health care provider before traveling far distances. Do not use hot tubs, steam rooms, or saunas. Wear your seat belt at all times when driving or riding in a car. Talk with your health care provider if someone is verbally or physically abusive to you. Preparing for birth To prepare for the arrival of your baby: Take prenatal classes to understand, practice, and ask questions about labor and delivery. Visit the hospital and tour the maternity area. Purchase a rear-facing car seat and make sure you know how to install it in your car. Prepare the baby's room or sleeping area. Make  sure to remove all pillows and stuffed animals from the baby's crib to prevent suffocation. General instructions Avoid cat litter boxes and soil used by cats. These carry germs that can cause birth defects in the baby. If you have a cat, ask someone to clean the litter box for you. Do not douche or use tampons. Do not use scented sanitary pads. Do not use any products that contain nicotine or tobacco, such as cigarettes, e-cigarettes, and chewing tobacco. If you need help quitting, ask your health care provider. Do not use any herbal remedies, illegal drugs, or medicines that were not prescribed to you. Chemicals in these products can harm your baby. Do not drink alcohol. You will have more frequent prenatal exams during the third trimester. During a routine prenatal visit, your health care provider will do a physical exam, perform tests, and discuss your overall health. Keep all follow-up visits. This is important. Where to find more information American Pregnancy Association: americanpregnancy.org [employer]ians [employer] Gynecologists: https://www.todd-brady.net/ Office on [employer] Health: MightyReward.co.nz Contact a health care provider if you have: A fever. Mild pelvic cramps, pelvic pressure, or nagging pain in your abdominal area or lower back. Vomiting or diarrhea. Bad-smelling vaginal discharge or foul-smelling urine. Pain when you urinate. A headache that does not go away when you take medicine. Visual changes or see spots in front of your eyes. Get help right away if: Your water breaks. You have regular contractions less than 5 minutes apart. You have spotting or bleeding from your vagina. You have severe abdominal pain. You have difficulty breathing. You have chest pain. You have fainting spells. You have not felt your baby move for the time period told by your health care provider. You have new or increased pain, swelling, or redness in an arm  or leg. Summary The third trimester of pregnancy is from week 28 through week 40 (months 7 through 9). You may have more problems sleeping. This can be caused by the size of your abdomen, an increased need to urinate, and an increase in your body's metabolism. You will have more frequent prenatal exams during the third trimester. Keep all follow-up visits. This is important. This information is not intended to replace advice given to you by your health care provider. Make sure you discuss any questions you have with your health care provider. Document Revised: 04/26/2020 Document Reviewed: 03/02/2020 Elsevier Patient Education  2022 Elsevier Inc.       Preterm Labor The normal length of a pregnancy is 39-41 weeks. Preterm labor is when labor starts before 37 completed weeks of pregnancy. Babies who are born prematurely and survive may not be fully developed and may be at an increased risk for long-term problems such as cerebral palsy, developmental delays, and vision and hearing problems. Babies who are born too early may have problems soon after  birth. Premature babies may have problems regulating blood sugar, body temperature, heart rate, and breathing rate. These babies often have trouble with feeding. The risk of having problems is highest for babies who are born before 34 weeks of pregnancy. What are the causes? The exact cause of this condition is not known. What increases the risk? You are more likely to have preterm labor if you have certain risk factors that relate to your medical history, problems with present and past pregnancies, and lifestyle factors. Medical history You have abnormalities of the uterus, including a short cervix. You have STIs (sexually transmitted infections) or other infections of the urinary tract and the vagina. You have chronic illnesses, such as blood clotting problems, diabetes, or high blood pressure. You are overweight or underweight. Present and past  pregnancies You have had preterm labor before. You are pregnant with twins or other multiples. You have been diagnosed with a condition in which the placenta covers your cervix (placenta previa). You waited less than 18 months between giving birth and becoming pregnant again. Your unborn baby has some abnormalities. You have vaginal bleeding during pregnancy. You became pregnant through in vitro fertilization (IVF). Lifestyle and environmental factors You use tobacco products or drink alcohol. You use drugs. You have stress and no social support. You experience domestic violence. You are exposed to certain chemicals or environmental pollutants. Other factors You are younger than age 66 or older than age 22. What are the signs or symptoms? Symptoms of this condition include: Cramps similar to those that can happen during a menstrual period. The cramps may happen with diarrhea. Pain in the abdomen or lower back. Regular contractions that may feel like tightening of the abdomen. A feeling of increased pressure in the pelvis. Increased watery or bloody mucus discharge from the vagina. Water breaking (ruptured amniotic sac). How is this diagnosed? This condition is diagnosed based on: Your medical history and a physical exam. A pelvic exam. An ultrasound. Monitoring your uterus for contractions. Other tests, including: A swab of the cervix to check for a chemical called fetal fibronectin. Urine tests. How is this treated? Treatment for this condition depends on the length of your pregnancy, your condition, and the health of your baby. Treatment may include: Taking medicines, such as: Hormone medicines. These may be given early in pregnancy to help support the pregnancy. Medicines to stop contractions. Medicines to help mature the baby's lungs. These may be prescribed if the risk of delivery is high. Medicines to help protect your baby from brain and nerve complications such as  cerebral palsy. Bed rest. If the labor happens before 34 weeks of pregnancy, you may need to stay in the hospital. Delivery of the baby. Follow these instructions at home:  Do not use any products that contain nicotine or tobacco. These products include cigarettes, chewing tobacco, and vaping devices, such as e-cigarettes. If you need help quitting, ask your health care provider. Do not drink alcohol. Take over-the-counter and prescription medicines only as told by your health care provider. Rest as told by your health care provider. Return to your normal activities as told by your health care provider. Ask your health care provider what activities are safe for you. Keep all follow-up visits. This is important. How is this prevented? To increase your chance of having a full-term pregnancy: Do not use drugs or take medicines that have not been prescribed to you during your pregnancy. Talk with your health care provider before taking any herbal supplements, even  if you have been taking them regularly. Make sure you gain a healthy amount of weight during your pregnancy. Watch for infection. If you think that you might have an infection, get it checked right away. Symptoms of infection may include: Fever. Abnormal vaginal discharge or discharge that smells bad. Pain or burning with urination. Needing to urinate urgently. Frequently urinating or passing small amounts of urine frequently. Blood in your urine or urine that smells bad or unusual. Where to find more information [place]. Department of Health and Cytogeneticist on Women's Health: http://hoffman.com/ [employer]ians [employer]necologists: www.acog.org Centers for Disease Control and Prevention, Preterm Birth: FootballExhibition.com.br Contact a health care provider if: You think you are going into preterm labor. You have signs or symptoms of preterm labor. You have symptoms of infection. Get help right away if: You are  having regular, painful contractions every 5 minutes or less. Your water breaks. Summary Preterm labor is labor that starts before you reach 37 weeks of pregnancy. Delivering your baby early increases your baby's risk of developing long-term problems. You are more likely to have preterm labor if you have certain risk factors that relate to your medical history, problems with present and past pregnancies, and lifestyle factors. Keep all follow-up visits. This is important. Contact a health care provider if you have signs or symptoms of preterm labor. This information is not intended to replace advice given to you by your health care provider. Make sure you discuss any questions you have with your health care provider. Document Revised: 11/21/2020 Document Reviewed: 11/21/2020 Elsevier Patient Education  2022 [employer].

## 2021-11-14 NOTE — Progress Notes (Signed)
Patient presents for ROB. Patient complains of having really bad swelling in her legs, hands, and face with soreness. She also complains of having intermittent headaches, but states that they are mild and goes away on their own.

## 2021-11-15 ENCOUNTER — Telehealth: Payer: Self-pay | Admitting: Women's Health

## 2021-11-15 LAB — COMPREHENSIVE METABOLIC PANEL
ALT: 12 IU/L (ref 0–32)
AST: 24 IU/L (ref 0–40)
Albumin/Globulin Ratio: 1.7 (ref 1.2–2.2)
Albumin: 3.3 g/dL — ABNORMAL LOW (ref 3.8–4.8)
Alkaline Phosphatase: 109 IU/L (ref 44–121)
BUN/Creatinine Ratio: 21 (ref 9–23)
BUN: 12 mg/dL (ref 6–20)
Bilirubin Total: 0.2 mg/dL (ref 0.0–1.2)
CO2: 17 mmol/L — ABNORMAL LOW (ref 20–29)
Calcium: 8.5 mg/dL — ABNORMAL LOW (ref 8.7–10.2)
Chloride: 107 mmol/L — ABNORMAL HIGH (ref 96–106)
Creatinine, Ser: 0.58 mg/dL (ref 0.57–1.00)
Globulin, Total: 2 g/dL (ref 1.5–4.5)
Glucose: 72 mg/dL (ref 70–99)
Potassium: 3.8 mmol/L (ref 3.5–5.2)
Sodium: 139 mmol/L (ref 134–144)
Total Protein: 5.3 g/dL — ABNORMAL LOW (ref 6.0–8.5)
eGFR: 124 mL/min/{1.73_m2} (ref >59)

## 2021-11-15 LAB — PROTEIN / CREATININE RATIO, URINE
Creatinine, Urine: 264.5 mg/dL
Protein, Ur: 163 mg/dL
Protein/Creat Ratio: 616 mg/g creat — ABNORMAL HIGH (ref 0–200)

## 2021-11-15 LAB — CBC
Hematocrit: 30.8 % — ABNORMAL LOW (ref 34.0–46.6)
Hemoglobin: 10.5 g/dL — ABNORMAL LOW (ref 11.1–15.9)
MCH: 30.1 pg (ref 26.6–33.0)
MCHC: 34.1 g/dL (ref 31.5–35.7)
MCV: 88 fL (ref 79–97)
Platelets: 240 10*3/uL (ref 150–450)
RBC: 3.49 x10E6/uL — ABNORMAL LOW (ref 3.77–5.28)
RDW: 11.9 % (ref 11.7–15.4)
WBC: 6.8 10*3/uL (ref 3.4–10.8)

## 2021-11-15 NOTE — Telephone Encounter (Signed)
Called patient on behalf of Donia Ast, notified her labs came back normal and reminded her to keep her BP check appointment tomorrow.

## 2021-11-16 ENCOUNTER — Ambulatory Visit (INDEPENDENT_AMBULATORY_CARE_PROVIDER_SITE_OTHER): Payer: Self-pay | Admitting: *Deleted

## 2021-11-16 ENCOUNTER — Other Ambulatory Visit: Payer: Self-pay

## 2021-11-16 VITALS — BP 125/83

## 2021-11-16 DIAGNOSIS — Z3A33 33 weeks gestation of pregnancy: Secondary | ICD-10-CM

## 2021-11-16 DIAGNOSIS — O368131 Decreased fetal movements, third trimester, fetus 1: Secondary | ICD-10-CM

## 2021-11-16 DIAGNOSIS — Z3480 Encounter for supervision of other normal pregnancy, unspecified trimester: Secondary | ICD-10-CM

## 2021-11-16 NOTE — Progress Notes (Signed)
Subjective:  Madison Hood is a 30 y.o. female here for BP check.   Hypertension ROS: Taking medication as prescribed, denies HA, visual changes,  still having LE swelling- pt states no change.    Objective:  BP 125/83    LMP 03/30/2021 Comment: spotted one day  Appearance alert, well appearing, and in no distress. Pt states decrease in FM, Nst ordered today.   General exam BP noted to be well controlled today in office.    Assessment:   Blood Pressure stable.   Plan:  Review with Dr Debroah Loop - NST reactive. Pt to start weekly antenatal testing and see provider weekly.  U/S orders have been placed and scheduled .

## 2021-11-19 ENCOUNTER — Ambulatory Visit (INDEPENDENT_AMBULATORY_CARE_PROVIDER_SITE_OTHER): Payer: Self-pay | Admitting: Obstetrics and Gynecology

## 2021-11-19 ENCOUNTER — Other Ambulatory Visit: Payer: Self-pay

## 2021-11-19 ENCOUNTER — Ambulatory Visit (INDEPENDENT_AMBULATORY_CARE_PROVIDER_SITE_OTHER): Payer: Self-pay

## 2021-11-19 ENCOUNTER — Encounter: Payer: Self-pay | Admitting: Obstetrics and Gynecology

## 2021-11-19 ENCOUNTER — Ambulatory Visit: Payer: Self-pay

## 2021-11-19 VITALS — BP 139/88 | HR 76 | Wt 211.6 lb

## 2021-11-19 DIAGNOSIS — O1403 Mild to moderate pre-eclampsia, third trimester: Secondary | ICD-10-CM

## 2021-11-19 DIAGNOSIS — O1400 Mild to moderate pre-eclampsia, unspecified trimester: Secondary | ICD-10-CM | POA: Insufficient documentation

## 2021-11-19 DIAGNOSIS — Z3480 Encounter for supervision of other normal pregnancy, unspecified trimester: Secondary | ICD-10-CM

## 2021-11-19 DIAGNOSIS — Z3A33 33 weeks gestation of pregnancy: Secondary | ICD-10-CM

## 2021-11-19 IMAGING — US US OB LIMITED
1 series · 6 of 6 positions shown · non-contrast
Comparison: none

[Series 1: us ob limited · 6 of 6 slices shown]
[im 1/6]
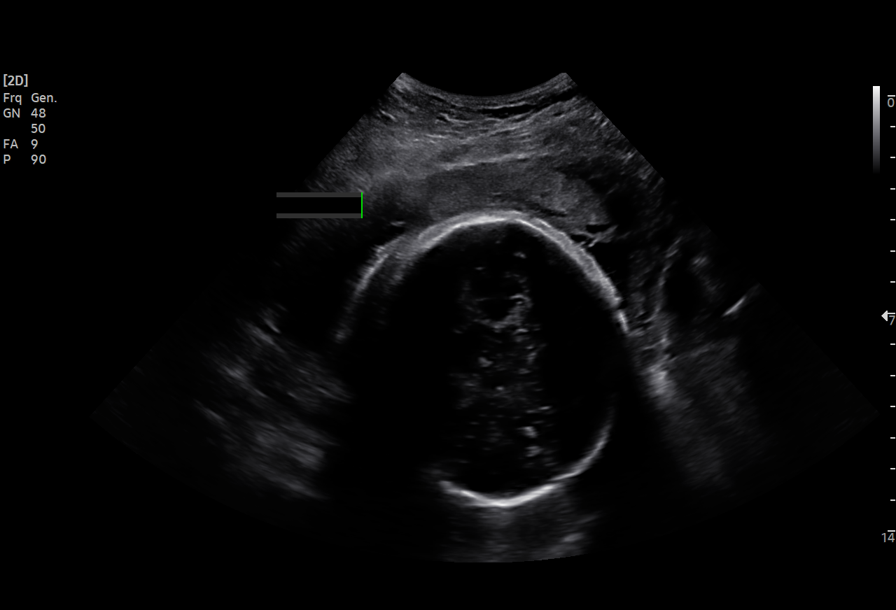
[im 2/6]
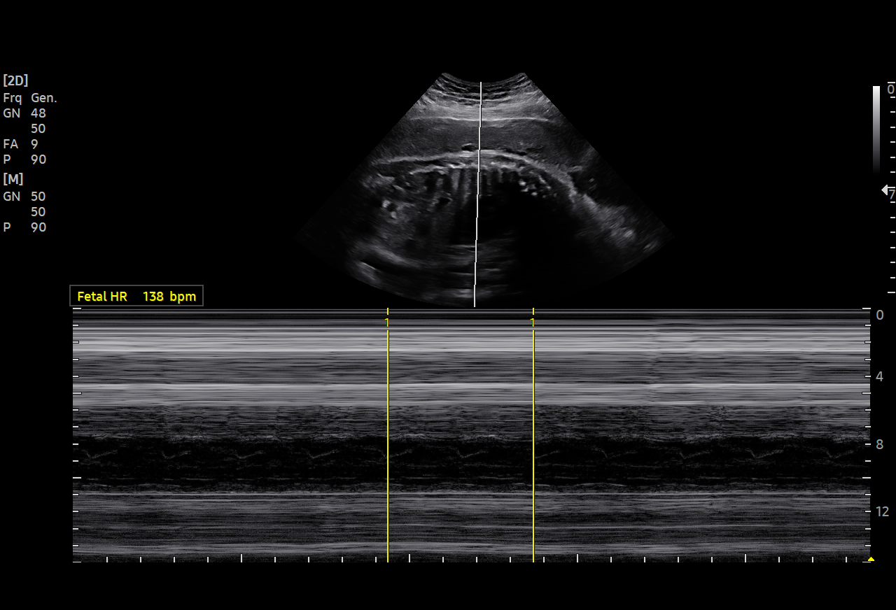
[im 3/6]
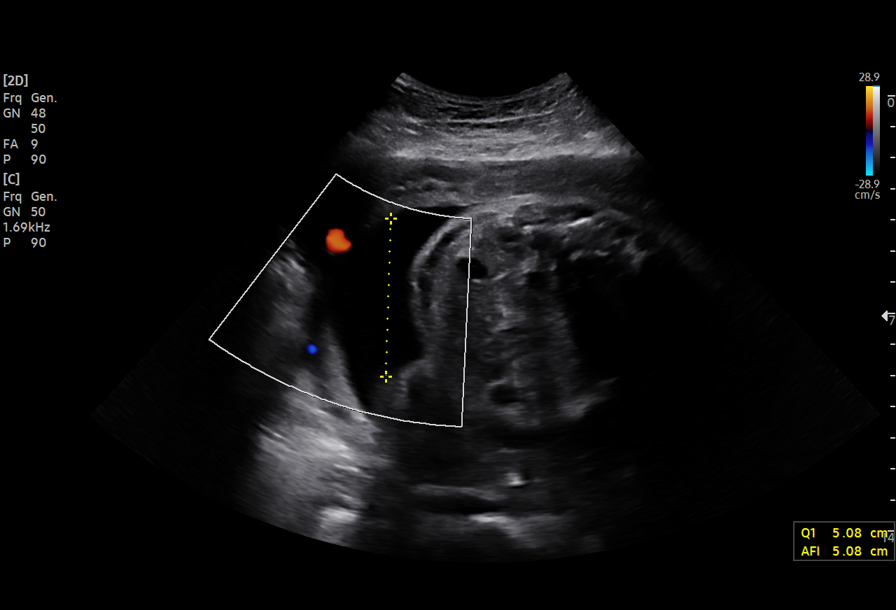
[im 4/6]
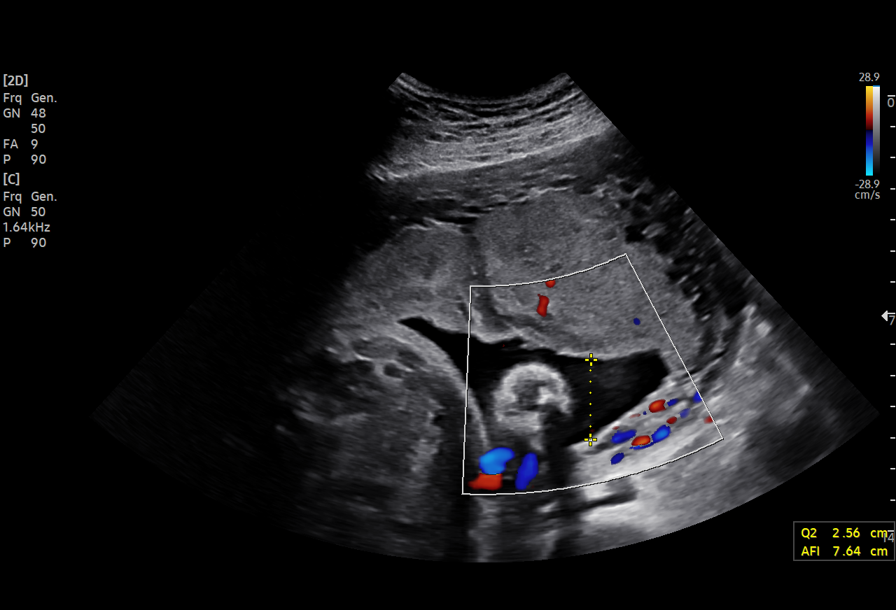
[im 5/6]
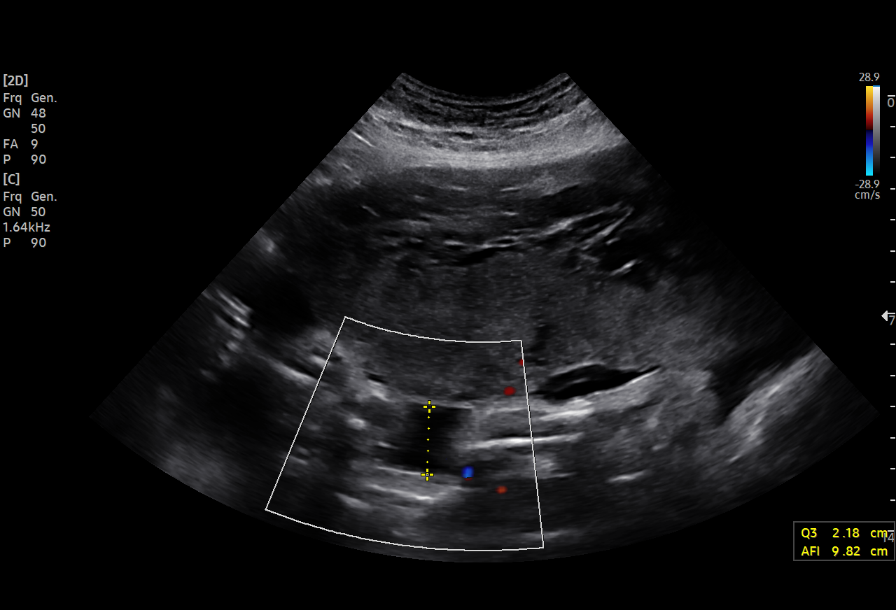
[im 6/6]
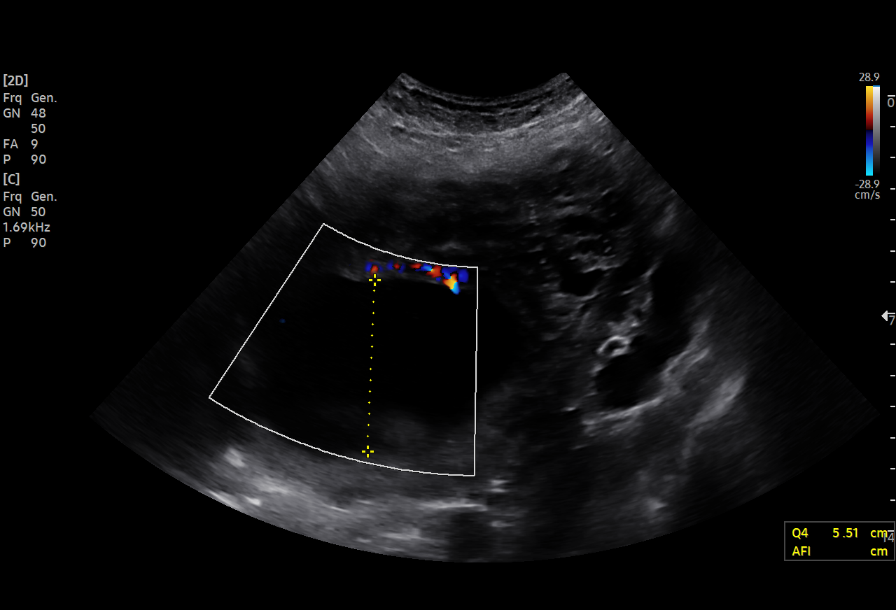

[6 of 6 positions shown; findings below may reference images not displayed]

[REDACTED]care

 1  [HOSPITAL]                         [address].0     POZISA

Indications

 33 weeks gestation of pregnancy
 Pre-eclampsia                                   [XH]
Fetal Evaluation

 Num Of Fetuses:          1
 Fetal Heart Rate(bpm):   138
 Cardiac Activity:        Observed
 Presentation:            Cephalic

 AFI Sum(cm)                 Largest Pocket(cm)


 RUQ(cm)       RLQ(cm)       LUQ(cm)        LLQ(cm)

OB History

 Gravidity:    4         Term:   1         SAB:   2
Comments

 A limited ultrasound performed today shows that the fetus is
 in the vertex presentation.

 There was normal amniotic fluid noted.
Impression
 Vertex
Recommendations

 Follow up OB U/S as clinically indicated

## 2021-11-19 NOTE — Progress Notes (Signed)
Patient presents for ROB and AFI/NST. Patient states that she checked her BP on Saturday and her BP was 140/81 and the repeat was 130/81. She denies having any headache or visual changes or upper abdominal pain.  AFI 15.3

## 2021-11-19 NOTE — Progress Notes (Deleted)
AFI 15.3.   See provider visit note

## 2021-11-19 NOTE — Patient Instructions (Signed)
Preeclampsia and Eclampsia Preeclampsia is a serious condition that may develop during pregnancy. This condition involves high blood pressure during pregnancy and causes symptoms such as headaches, vision changes, and increased swelling in the legs, hands, and face. Preeclampsia occurs after 20 weeks of pregnancy. Eclampsia is a seizure that happens from worsening preeclampsia. Diagnosing and managing preeclampsia early is important. If not treated early, it can cause serious problems for mother and baby. There is no cure for this condition. However, during pregnancy, delivering the baby may be the best treatment for preeclampsia or eclampsia. For most women, symptoms of preeclampsia and eclampsia go away after giving birth. In rare cases, a woman may develop preeclampsia or eclampsia after giving birth. This usually occurs within 48 hours after childbirth but may occur up to 6 weeks after giving birth. What are the causes? The cause of this condition is not known. What increases the risk? The following factors make you more likely to develop preeclampsia: Being pregnant for the first time or being pregnant with multiples. Having had preeclampsia or a condition called hemolysis, elevated liver enzymes, and low platelet count (HELLP)syndrome during a past pregnancy. Having a family history of preeclampsia. Being older than age 32. Being obese. Becoming pregnant through fertility treatments. Conditions that reduce blood flow or oxygen to your placenta and baby may also increase your risk. These include: High blood pressure before, during, or immediately following pregnancy. Kidney disease. Diabetes. Blood clotting disorders. Autoimmune diseases, such as lupus. Sleep apnea. What are the signs or symptoms? Common symptoms of this condition include: A severe, throbbing headache that does not go away. Vision problems, such as blurred or double vision and light sensitivity. Pain in the stomach,  especially the right upper region. Pain in the shoulder. Other symptoms that may develop as the condition gets worse include: Sudden weight gain because of fluid buildup in the body. This causes swelling of the face, hands, legs, and feet. Severe nausea and vomiting. Urinating less than usual. Shortness of breath. Seizures. How is this diagnosed? Your health care provider will ask you about symptoms and check for signs of preeclampsia during your prenatal visits. You will also have routine tests, including: Checking your blood pressure. Urine tests to check for protein. Blood tests to assess your organ function. Monitoring your baby's heart rate. Ultrasounds to check fetal growth. How is this treated? You and your health care provider will determine the treatment that is best for you. Treatment may include: Frequent prenatal visits to check for preeclampsia. Medicine to lower your blood pressure. Medicine to prevent seizures. Low-dose aspirin during your pregnancy. Staying in the hospital, in severe cases. You will be given medicines to control your blood pressure and the amount of fluids in your body. Delivering your baby. Work with your health care provider to manage any chronic health conditions, such as diabetes or kidney problems. Also, work with your health care provider to manage weight gain during pregnancy. Follow these instructions at home: Eating and drinking Drink enough fluid to keep your urine pale yellow. Avoid caffeine. Caffeine may increase blood pressure and heart rate and lead to dehydration. Reduce the amount of salt that you eat. Lifestyle Do not use any products that contain nicotine or tobacco. These products include cigarettes, chewing tobacco, and vaping devices, such as e-cigarettes. If you need help quitting, ask your health care provider. Do not use alcohol or drugs. Avoid stress as much as possible. Rest and get plenty of sleep. General  instructions  Take  over-the-counter and prescription medicines only as told by your health care provider. When lying down, lie on your left side. This keeps pressure off your major blood vessels. When sitting or lying down, raise (elevate) your feet. Try putting pillows underneath your lower legs. Exercise regularly. Ask your health care provider what kinds of exercise are best for you. Check your blood pressure as often as recommended by your health care provider. Keep all prenatal and follow-up visits. This is important. Contact a health care provider if: You have symptoms that may need treatment or closer monitoring. These include: Headaches. Stomach pain or nausea and vomiting. Shoulder pain. Vision problems, such as spots in front of your eyes or blurry vision. Sudden weight gain or increased swelling in your face, hands, legs, and feet. Increased anxiety or feeling of impending doom. Signs or symptoms of labor. Get help right away if: You have any of the following symptoms: A seizure. Shortness of breath or trouble breathing. Trouble speaking or slurred speech. Fainting. Chest pain. These symptoms may represent a serious problem that is an emergency. Do not wait to see if the symptoms will go away. Get medical help right away. Call your local emergency services (911 in the [place] Do not drive yourself to the hospital. Summary Preeclampsia is a serious condition that may develop during pregnancy. Diagnosing and treating preeclampsia early is very important. Keep all prenatal and follow-up visits. This is important. Get help right away if you have a seizure, shortness of breath or trouble breathing, trouble speaking or slurred speech, chest pain, or fainting. This information is not intended to replace advice given to you by your health care provider. Make sure you discuss any questions you have with your health care provider. Document Revised: 08/10/2020 Document Reviewed:  08/10/2020 Elsevier Patient Education  2022 [employer]. Third Trimester of Pregnancy The third trimester of pregnancy is from week 28 through week 40. This is months 7 through 9. The third trimester is a time when the unborn baby (fetus) is growing rapidly. At the end of the ninth month, the fetus is about 20 inches long and weighs 6-10 pounds. Body changes during your third trimester During the third trimester, your body will continue to go through many changes. The changes vary and generally return to normal after your baby is born. Physical changes Your weight will continue to increase. You can expect to gain 25-35 pounds (11-16 kg) by the end of the pregnancy if you begin pregnancy at a normal weight. If you are underweight, you can expect to gain 28-40 lb (about 13-18 kg), and if you are overweight, you can expect to gain 15-25 lb (about 7-11 kg). You may begin to get stretch marks on your hips, abdomen, and breasts. Your breasts will continue to grow and may hurt. A yellow fluid (colostrum) may leak from your breasts. This is the first milk you are producing for your baby. You may have changes in your hair. These can include thickening of your hair, rapid growth, and changes in texture. Some people also have hair loss during or after pregnancy, or hair that feels dry or thin. Your belly button may stick out. You may notice more swelling in your hands, face, or ankles. Health changes You may have heartburn. You may have constipation. You may develop hemorrhoids. You may develop swollen, bulging veins (varicose veins) in your legs. You may have increased body aches in the pelvis, back, or thighs. This is due to weight gain and increased hormones  that are relaxing your joints. You may have increased tingling or numbness in your hands, arms, and legs. The skin on your abdomen may also feel numb. You may feel short of breath because of your expanding uterus. Other changes You may urinate  more often because the fetus is moving lower into your pelvis and pressing on your bladder. You may have more problems sleeping. This may be caused by the size of your abdomen, an increased need to urinate, and an increase in your body's metabolism. You may notice the fetus "dropping," or moving lower in your abdomen (lightening). You may have increased vaginal discharge. You may notice that you have pain around your pelvic bone as your uterus distends. Follow these instructions at home: Medicines Follow your health care provider's instructions regarding medicine use. Specific medicines may be either safe or unsafe to take during pregnancy. Do not take any medicines unless approved by your health care provider. Take a prenatal vitamin that contains at least 600 micrograms (mcg) of folic acid. Eating and drinking Eat a healthy diet that includes fresh fruits and vegetables, whole grains, good sources of protein such as meat, eggs, or tofu, and low-fat dairy products. Avoid raw meat and unpasteurized juice, milk, and cheese. These carry germs that can harm you and your baby. Eat 4 or 5 small meals rather than 3 large meals a day. You may need to take these actions to prevent or treat constipation: Drink enough fluid to keep your urine pale yellow. Eat foods that are high in fiber, such as beans, whole grains, and fresh fruits and vegetables. Limit foods that are high in fat and processed sugars, such as fried or sweet foods. Activity Exercise only as directed by your health care provider. Most people can continue their usual exercise routine during pregnancy. Try to exercise for 30 minutes at least 5 days a week. Stop exercising if you experience contractions in the uterus. Stop exercising if you develop pain or cramping in the lower abdomen or lower back. Avoid heavy lifting. Do not exercise if it is very hot or humid or if you are at a high altitude. If you choose to, you may continue to have  sex unless your health care provider tells you not to. Relieving pain and discomfort Take frequent breaks and rest with your legs raised (elevated) if you have leg cramps or low back pain. Take warm sitz baths to soothe any pain or discomfort caused by hemorrhoids. Use hemorrhoid cream if your health care provider approves. Wear a supportive bra to prevent discomfort from breast tenderness. If you develop varicose veins: Wear support hose as told by your health care provider. Elevate your feet for 15 minutes, 3-4 times a day. Limit salt in your diet. Safety Talk to your health care provider before traveling far distances. Do not use hot tubs, steam rooms, or saunas. Wear your seat belt at all times when driving or riding in a car. Talk with your health care provider if someone is verbally or physically abusive to you. Preparing for birth To prepare for the arrival of your baby: Take prenatal classes to understand, practice, and ask questions about labor and delivery. Visit the hospital and tour the maternity area. Purchase a rear-facing car seat and make sure you know how to install it in your car. Prepare the baby's room or sleeping area. Make sure to remove all pillows and stuffed animals from the baby's crib to prevent suffocation. General instructions Avoid cat litter boxes and  soil used by cats. These carry germs that can cause birth defects in the baby. If you have a cat, ask someone to clean the litter box for you. Do not douche or use tampons. Do not use scented sanitary pads. Do not use any products that contain nicotine or tobacco, such as cigarettes, e-cigarettes, and chewing tobacco. If you need help quitting, ask your health care provider. Do not use any herbal remedies, illegal drugs, or medicines that were not prescribed to you. Chemicals in these products can harm your baby. Do not drink alcohol. You will have more frequent prenatal exams during the third trimester. During a  routine prenatal visit, your health care provider will do a physical exam, perform tests, and discuss your overall health. Keep all follow-up visits. This is important. Where to find more information American Pregnancy Association: americanpregnancy.org [employer]: https://www.todd-brady.net/ Office on [employer] Health: Might[employer].nz Contact a health care provider if you have: A fever. Mild pelvic cramps, pelvic pressure, or nagging pain in your abdominal area or lower back. Vomiting or diarrhea. Bad-smelling vaginal discharge or foul-smelling urine. Pain when you urinate. A headache that does not go away when you take medicine. Visual changes or see spots in front of your eyes. Get help right away if: Your water breaks. You have regular contractions less than 5 minutes apart. You have spotting or bleeding from your vagina. You have severe abdominal pain. You have difficulty breathing. You have chest pain. You have fainting spells. You have not felt your baby move for the time period told by your health care provider. You have new or increased pain, swelling, or redness in an arm or leg. Summary The third trimester of pregnancy is from week 28 through week 40 (months 7 through 9). You may have more problems sleeping. This can be caused by the size of your abdomen, an increased need to urinate, and an increase in your body's metabolism. You will have more frequent prenatal exams during the third trimester. Keep all follow-up visits. This is important. This information is not intended to replace advice given to you by your health care provider. Make sure you discuss any questions you have with your health care provider. Document Revised: 04/26/2020 Document Reviewed: 03/02/2020 Elsevier Patient Education  2022 [employer].

## 2021-11-19 NOTE — Progress Notes (Signed)
Subjective:  Madison Hood is a 30 y.o. G4P1021 at [redacted]w[redacted]d being seen today for ongoing prenatal care.  She is currently monitored for the following issues for this high-risk pregnancy and has Major depressive disorder, single episode, mild (HCC); Anxiety; Abnormal Pap smear of cervix  09/06/20 neg HPV +; Supervision of other normal pregnancy, antepartum; Constipation during pregnancy in second trimester; COVID-19 affecting pregnancy in second trimester; and Mild pre-eclampsia on their problem list.  Patient reports Denies HA or visual changes.  Contractions: Not present. Vag. Bleeding: None.  Movement: Present. Denies leaking of fluid.   The following portions of the patient's history were reviewed and updated as appropriate: allergies, current medications, past family history, past medical history, past social history, past surgical history and problem list. Problem list updated.  Objective:   Vitals:   11/19/21 1016  BP: 139/88  Pulse: 76  Weight: 211 lb 9.6 oz (96 kg)    Fetal Status:     Movement: Present     General:  Alert, oriented and cooperative. Patient is in no acute distress.  Skin: Skin is warm and dry. No rash noted.   Cardiovascular: Normal heart rate noted  Respiratory: Normal respiratory effort, no problems with respiration noted  Abdomen: Soft, gravid, appropriate for gestational age. Pain/Pressure: Absent     Pelvic:  Cervical exam deferred        Extremities: Normal range of motion.  Edema: Deep pitting, indentation remains for a short time  Mental Status: Normal mood and affect. Normal behavior. Normal judgment and thought content.   Urinalysis:      Assessment and Plan:  Pregnancy: G4P1021 at [redacted]w[redacted]d  1. Supervision of other normal pregnancy, antepartum Stable   2. Mild pre-eclampsia in third trimester Home BP's 140/90's. NST performed today was reviewed and was found to be reactive.  AFI normal at 10.3 cm.  Continue recommended antenatal testing and  prenatal care.  Mild preeclampsia reviewed with pt. BP reviewed. Will begin weekly antenatal test and serial growth scans. - US OB Limited; Future - Fetal nonstress test - [place] MFM OB COMP + 14 WK; Future  Preterm labor symptoms and general obstetric precautions including but not limited to vaginal bleeding, contractions, leaking of fluid and fetal movement were reviewed in detail with the patient. Please refer to After Visit Summary for other counseling recommendations.  Return in about 1 week (around 11/26/2021) for OB visit, face to face, MD only.   Hermina Staggers, MD

## 2021-11-27 ENCOUNTER — Ambulatory Visit (INDEPENDENT_AMBULATORY_CARE_PROVIDER_SITE_OTHER): Payer: Self-pay | Admitting: Obstetrics and Gynecology

## 2021-11-27 ENCOUNTER — Encounter: Payer: Self-pay | Admitting: Obstetrics and Gynecology

## 2021-11-27 ENCOUNTER — Encounter (HOSPITAL_COMMUNITY): Payer: Self-pay | Admitting: Obstetrics & Gynecology

## 2021-11-27 ENCOUNTER — Other Ambulatory Visit: Payer: Self-pay

## 2021-11-27 ENCOUNTER — Inpatient Hospital Stay (HOSPITAL_COMMUNITY)
Admission: AD | Admit: 2021-11-27 | Discharge: 2021-11-28 | DRG: 833 | Disposition: A | Discharge status: Home / Self Care | Payer: Self-pay | Source: Ambulatory Visit | Attending: Obstetrics and Gynecology | Admitting: Obstetrics and Gynecology

## 2021-11-27 VITALS — BP 144/102 | HR 64 | Wt 211.0 lb

## 2021-11-27 DIAGNOSIS — O1403 Mild to moderate pre-eclampsia, third trimester: Secondary | ICD-10-CM

## 2021-11-27 DIAGNOSIS — Z3A34 34 weeks gestation of pregnancy: Secondary | ICD-10-CM

## 2021-11-27 DIAGNOSIS — O1400 Mild to moderate pre-eclampsia, unspecified trimester: Secondary | ICD-10-CM | POA: Diagnosis present

## 2021-11-27 DIAGNOSIS — Z20822 Contact with and (suspected) exposure to covid-19: Secondary | ICD-10-CM | POA: Diagnosis present

## 2021-11-27 DIAGNOSIS — O119 Pre-existing hypertension with pre-eclampsia, unspecified trimester: Secondary | ICD-10-CM | POA: Diagnosis present

## 2021-11-27 DIAGNOSIS — O368130 Decreased fetal movements, third trimester, not applicable or unspecified: Secondary | ICD-10-CM | POA: Diagnosis present

## 2021-11-27 DIAGNOSIS — Z3480 Encounter for supervision of other normal pregnancy, unspecified trimester: Secondary | ICD-10-CM

## 2021-11-27 DIAGNOSIS — Z8616 Personal history of COVID-19: Secondary | ICD-10-CM

## 2021-11-27 DIAGNOSIS — O10013 Pre-existing essential hypertension complicating pregnancy, third trimester: Secondary | ICD-10-CM | POA: Diagnosis present

## 2021-11-27 DIAGNOSIS — O113 Pre-existing hypertension with pre-eclampsia, third trimester: Principal | ICD-10-CM | POA: Diagnosis present

## 2021-11-27 LAB — COMPREHENSIVE METABOLIC PANEL
ALT: 15 U/L (ref 0–44)
AST: 24 U/L (ref 15–41)
Albumin: 2.1 g/dL — ABNORMAL LOW (ref 3.5–5.0)
Alkaline Phosphatase: 92 U/L (ref 38–126)
Anion gap: 6 (ref 5–15)
BUN: 9 mg/dL (ref 6–20)
CO2: 20 mmol/L — ABNORMAL LOW (ref 22–32)
Calcium: 8 mg/dL — ABNORMAL LOW (ref 8.9–10.3)
Chloride: 109 mmol/L (ref 98–111)
Creatinine, Ser: 0.65 mg/dL (ref 0.44–1.00)
GFR, Estimated: >60 mL/min (ref >60)
Glucose, Bld: 77 mg/dL (ref 70–99)
Potassium: 3.8 mmol/L (ref 3.5–5.1)
Sodium: 135 mmol/L (ref 135–145)
Total Bilirubin: 0.5 mg/dL (ref 0.3–1.2)
Total Protein: 4.7 g/dL — ABNORMAL LOW (ref 6.5–8.1)

## 2021-11-27 LAB — TYPE AND SCREEN
ABO/RH(D): A POS
Antibody Screen: NEGATIVE

## 2021-11-27 LAB — CBC
HCT: 28.8 % — ABNORMAL LOW (ref 36.0–46.0)
Hemoglobin: 10.1 g/dL — ABNORMAL LOW (ref 12.0–15.0)
MCH: 31.7 pg (ref 26.0–34.0)
MCHC: 35.1 g/dL (ref 30.0–36.0)
MCV: 90.3 fL (ref 80.0–100.0)
Platelets: 225 10*3/uL (ref 150–400)
RBC: 3.19 MIL/uL — ABNORMAL LOW (ref 3.87–5.11)
RDW: 12.7 % (ref 11.5–15.5)
WBC: 7.7 10*3/uL (ref 4.0–10.5)
nRBC: 0 % (ref 0.0–0.2)

## 2021-11-27 LAB — RESP PANEL BY RT-PCR (FLU A&B, COVID) ARPGX2
Influenza A by PCR: NEGATIVE
Influenza B by PCR: NEGATIVE
SARS Coronavirus 2 by RT PCR: NEGATIVE

## 2021-11-27 MED ORDER — SODIUM CHLORIDE 0.9 % IV SOLN: 250.0000 mL | INTRAVENOUS | Status: DC | PRN

## 2021-11-27 MED ORDER — SODIUM CHLORIDE 0.9% FLUSH: 3.0000 mL | Freq: Two times a day (BID) | INTRAVENOUS | Status: DC

## 2021-11-27 MED ORDER — LABETALOL HCL 5 MG/ML IV SOLN: 40.0000 mg | INTRAVENOUS | Status: DC | PRN

## 2021-11-27 MED ORDER — HYDRALAZINE HCL 20 MG/ML IJ SOLN: 10.0000 mg | INTRAMUSCULAR | Status: DC | PRN

## 2021-11-27 MED ORDER — ACETAMINOPHEN 325 MG PO TABS: 650.0000 mg | ORAL_TABLET | ORAL | Status: DC | PRN

## 2021-11-27 MED ORDER — ZOLPIDEM TARTRATE 5 MG PO TABS: 5.0000 mg | ORAL_TABLET | Freq: Every evening | ORAL | Status: DC | PRN

## 2021-11-27 MED ORDER — SODIUM CHLORIDE 0.9% FLUSH: 3.0000 mL | INTRAVENOUS | Status: DC | PRN

## 2021-11-27 MED ORDER — CALCIUM CARBONATE ANTACID 500 MG PO CHEW: 2.0000 | CHEWABLE_TABLET | ORAL | Status: DC | PRN

## 2021-11-27 MED ORDER — PRENATAL MULTIVITAMIN CH
1.0000 | ORAL_TABLET | Freq: Every day | ORAL | Status: DC
  Administered 2021-11-28: 11:00:00 1 via ORAL
  Filled 2021-11-27: qty 1

## 2021-11-27 MED ORDER — LABETALOL HCL 5 MG/ML IV SOLN: 80.0000 mg | INTRAVENOUS | Status: DC | PRN

## 2021-11-27 MED ORDER — DOCUSATE SODIUM 100 MG PO CAPS
100.0000 mg | ORAL_CAPSULE | Freq: Every day | ORAL | Status: DC
  Administered 2021-11-28: 11:00:00 100 mg via ORAL
  Filled 2021-11-27: qty 1

## 2021-11-27 MED ORDER — LABETALOL HCL 5 MG/ML IV SOLN: 20.0000 mg | INTRAVENOUS | Status: DC | PRN

## 2021-11-27 NOTE — H&P (Signed)
FACULTY PRACTICE ANTEPARTUM ADMISSION HISTORY AND PHYSICAL NOTE   History of Present Illness: Madison Hood is a 30 y.o. [id number] at [redacted]w[redacted]d admitted for preeclampsia with BP exacerbation.  Pt had blood pressure of 146/104 and 144/102 in outpatient setting.  She denies current headache, visual changes or RUQ pain. Patient reports the fetal movement as decreased .  However, with further interrogation she notes 6-10 movements /hour. Patient reports uterine contraction  activity as none. Patient reports  vaginal bleeding as none. Patient describes fluid per vagina as None. Fetal presentation is cephalic.  Patient Active Problem List   Diagnosis Date Noted   Chronic hypertension with superimposed preeclampsia 11/27/2021   Mild pre-eclampsia 11/19/2021   COVID-19 affecting pregnancy in second trimester 09/20/2021   Constipation during pregnancy in second trimester 08/22/2021   Supervision of other normal pregnancy, antepartum 06/13/2021   Abnormal Pap smear of cervix  09/06/20 neg HPV + 09/14/2020   Anxiety 09/06/2020   Major depressive disorder, single episode, mild (HCC) 01/20/2017    Past Medical History:  Diagnosis Date   History of anemia    History of elective abortion    History of spontaneous abortion     Past Surgical History:  Procedure Laterality Date   denies surgical history      OB History  Gravida Para Term Preterm AB Living  4 1 1  0 2 1  SAB IAB Ectopic Multiple Live Births  2 0 0 0 1    # Outcome Date GA Lbr Len/2nd Weight Sex Delivery Anes PTL Lv  4 Current           3 Term 06/08/16 [redacted]w[redacted]d 04:21 / 03:37 3870 g F Vag-Spont EPI  LIV  2 SAB 2012          1 SAB 2009 [redacted]w[redacted]d           Social History   Socioeconomic History   Marital status: Married    Spouse name: separated currently   Number of children: 1   Years of education: 12   Highest education level: High school graduate  Occupational History   Occupation: receptionist  Tobacco Use    Smoking status: Never   Smokeless tobacco: Never  Vaping Use   Vaping Use: Never used  Substance and Sexual Activity   Alcohol use: Not Currently    Alcohol/week: 1.0 - 5.0 standard drink    Types: 1 Glasses of wine per week    Comment:  Last use one glass of wine 2 days ago.   Drug use: Not Currently    Types: Marijuana    Comment: stopped in May 2022   Sexual activity: Yes    Partners: Male  Other Topics Concern   Not on file  Social History Narrative   From [place], [place]; in the [place] since 2005.   Social Determinants of Health   Financial Resource Strain: Not on file  Food Insecurity: Not on file  Transportation Needs: Not on file  Physical Activity: Not on file  Stress: Not on file  Social Connections: Not on file    Family History  Problem Relation Age of Onset   Hypertension Mother    Allergies Mother    Migraines Sister    Migraines Sister    Cancer Maternal Grandmother     No Known Allergies  Medications Prior to Admission  Medication Sig Dispense Refill Last Dose   acetaminophen (TYLENOL) 500 MG tablet Take 500 mg by mouth every 6 (six) hours as needed.  Prenatal Vit-Fe Fumarate-FA (PRENATAL MULTIVITAMIN) TABS tablet Take 1 tablet by mouth daily at 12 noon.       Review of Systems - History obtained from the patient  Vitals:  BP (!) 143/90    Pulse 63    Temp 97.8 F (36.6 C) (Axillary)    Resp 17    Ht 5\' 7"  (1.702 m)    Wt 95.7 kg    LMP 03/30/2021 Comment: spotted one day   SpO2 99%    BMI 33.05 kg/m  Physical Examination: CONSTITUTIONAL: Well-developed, well-nourished female in no acute distress.  HENT:  Normocephalic, atraumatic, External right and left ear normal. Oropharynx is clear and moist EYES: Conjunctivae and EOM are normal.  NECK: Normal range of motion, supple, no masses SKIN: Skin is warm and dry. No rash noted. Not diaphoretic. No erythema. No pallor. NEUROLGIC: Alert and oriented to person, place, and time. Normal reflexes,  muscle tone coordination. No cranial nerve deficit noted. 1+ DTR PSYCHIATRIC: Normal mood and affect. Normal behavior. Normal judgment and thought content. CARDIOVASCULAR: Normal heart rate noted, regular rhythm RESPIRATORY: Effort and breath sounds normal, no problems with respiration noted ABDOMEN: Soft, nontender, nondistended, gravid. MUSCULOSKELETAL: Normal range of motion. No edema and no tenderness. 2+ distal pulses.  Cervix:deferred Membranes:intact Fetal Monitoring:Baseline: 125 bpm, Variability: moderate, Accelerations: Reactive, and Decelerations: Absent Tocometer: Flat  Labs:  Results for orders placed or performed during the hospital encounter of 11/27/21 (from the past 24 hour(s))  Resp Panel by RT-PCR (Flu A&B, Covid) Nasopharyngeal Swab   Collection Time: 11/27/21  4:57 PM   Specimen: Nasopharyngeal Swab; Nasopharyngeal(NP) swabs in vial transport medium  Result Value Ref Range   SARS Coronavirus 2 by RT PCR NEGATIVE NEGATIVE   Influenza A by PCR NEGATIVE NEGATIVE   Influenza B by PCR NEGATIVE NEGATIVE  CBC   Collection Time: 11/27/21  7:07 PM  Result Value Ref Range   WBC 7.7 4.0 - 10.5 K/uL   RBC 3.19 (L) 3.87 - 5.11 MIL/uL   Hemoglobin 10.1 (L) 12.0 - 15.0 g/dL   HCT 11/29/21 (L) 28.3 - 15.1 %   MCV 90.3 80.0 - 100.0 fL   MCH 31.7 26.0 - 34.0 pg   MCHC 35.1 30.0 - 36.0 g/dL   RDW 76.1 60.7 - 37.1 %   Platelets 225 150 - 400 K/uL   nRBC 0.0 0.0 - 0.2 %  Type and screen [facility]   Collection Time: 11/27/21  7:07 PM  Result Value Ref Range   ABO/RH(D) PENDING    Antibody Screen PENDING    Sample Expiration      11/30/2021,2359 Performed at [facility], [address], [place], [place]     Imaging Studies: No results found.   Assessment and Plan: Patient Active Problem List   Diagnosis Date Noted   Chronic hypertension with superimposed preeclampsia 11/27/2021   Mild pre-eclampsia 11/19/2021   COVID-19 affecting  pregnancy in second trimester 09/20/2021   Constipation during pregnancy in second trimester 08/22/2021   Supervision of other normal pregnancy, antepartum 06/13/2021   Abnormal Pap smear of cervix  09/06/20 neg HPV + 09/14/2020   Anxiety 09/06/2020   Major depressive disorder, single episode, mild (HCC) 01/20/2017   Admit to Antenatal Serial BP, currently BP is mild range, pt has known mild preeclampsia Check preeclampsia labs Pt is past 34 weeks, no need for steroids currently as its use is controversial after 34 weeks Continue testing. Consider growth scan as  she is now diagnosed with preeclampsia, cannot find current growth in system Hold magnesium sulfate at this time as she is not severe range and is not currently being induced.Mariel Aloe, MD Faculty attending,  [facility] [employer].

## 2021-11-27 NOTE — Patient Instructions (Signed)
Preeclampsia and Eclampsia  Preeclampsia is a serious condition that may develop during pregnancy. This condition involves high blood pressure during pregnancy and causes symptoms such as headaches, vision changes, and increased swelling in the legs, hands, and face. Preeclampsia occurs after 20 weeks of pregnancy. Eclampsia is a seizure that happens from worsening preeclampsia. Diagnosing and managing preeclampsia early is important. If not treated early, it can cause serious problems for mother and baby.  There is no cure for this condition. However, during pregnancy, delivering the baby may be the best treatment for preeclampsia or eclampsia. For most women, symptoms of preeclampsia and eclampsia go away after giving birth. In rare cases, a woman may develop preeclampsia or eclampsia after giving birth. This usually occurs within 48 hours after childbirth but may occur up to 6 weeks after giving birth.  What are the causes?  The cause of this condition is not known.  What increases the risk?  The following factors make you more likely to develop preeclampsia:  Being pregnant for the first time or being pregnant with multiples.  Having had preeclampsia or a condition called hemolysis, elevated liver enzymes, and low platelet count (HELLP)syndrome during a past pregnancy.  Having a family history of preeclampsia.  Being older than age 35.  Being obese.  Becoming pregnant through fertility treatments.  Conditions that reduce blood flow or oxygen to your placenta and baby may also increase your risk. These include:  High blood pressure before, during, or immediately following pregnancy.  Kidney disease.  Diabetes.  Blood clotting disorders.  Autoimmune diseases, such as lupus.  Sleep apnea.  What are the signs or symptoms?  Common symptoms of this condition include:  A severe, throbbing headache that does not go away.  Vision problems, such as blurred or double vision and light sensitivity.  Pain in the stomach,  especially the right upper region.  Pain in the shoulder.  Other symptoms that may develop as the condition gets worse include:  Sudden weight gain because of fluid buildup in the body. This causes swelling of the face, hands, legs, and feet.  Severe nausea and vomiting.  Urinating less than usual.  Shortness of breath.  Seizures.  How is this diagnosed?    Your health care provider will ask you about symptoms and check for signs of preeclampsia during your prenatal visits. You will also have routine tests, including:  Checking your blood pressure.  Urine tests to check for protein.  Blood tests to assess your organ function.  Monitoring your baby's heart rate.  Ultrasounds to check fetal growth.  How is this treated?  You and your health care provider will determine the treatment that is best for you. Treatment may include:  Frequent prenatal visits to check for preeclampsia.  Medicine to lower your blood pressure.  Medicine to prevent seizures.  Low-dose aspirin during your pregnancy.  Staying in the hospital, in severe cases. You will be given medicines to control your blood pressure and the amount of fluids in your body.  Delivering your baby.  Work with your health care provider to manage any chronic health conditions, such as diabetes or kidney problems. Also, work with your health care provider to manage weight gain during pregnancy.  Follow these instructions at home:  Eating and drinking  Drink enough fluid to keep your urine pale yellow.  Avoid caffeine. Caffeine may increase blood pressure and heart rate and lead to dehydration.  Reduce the amount of salt that you eat.  Lifestyle    Do not use any products that contain nicotine or tobacco. These products include cigarettes, chewing tobacco, and vaping devices, such as e-cigarettes. If you need help quitting, ask your health care provider.  Do not use alcohol or drugs.  Avoid stress as much as possible.  Rest and get plenty of sleep.  General  instructions    Take over-the-counter and prescription medicines only as told by your health care provider.  When lying down, lie on your left side. This keeps pressure off your major blood vessels.  When sitting or lying down, raise (elevate) your feet. Try putting pillows underneath your lower legs.  Exercise regularly. Ask your health care provider what kinds of exercise are best for you.  Check your blood pressure as often as recommended by your health care provider.  Keep all prenatal and follow-up visits. This is important.  Contact a health care provider if:  You have symptoms that may need treatment or closer monitoring. These include:  Headaches.  Stomach pain or nausea and vomiting.  Shoulder pain.  Vision problems, such as spots in front of your eyes or blurry vision.  Sudden weight gain or increased swelling in your face, hands, legs, and feet.  Increased anxiety or feeling of impending doom.  Signs or symptoms of labor.  Get help right away if:  You have any of the following symptoms:  A seizure.  Shortness of breath or trouble breathing.  Trouble speaking or slurred speech.  Fainting.  Chest pain.  These symptoms may represent a serious problem that is an emergency. Do not wait to see if the symptoms will go away. Get medical help right away. Call your local emergency services (911 in the [place] Do not drive yourself to the hospital.  Summary  Preeclampsia is a serious condition that may develop during pregnancy.  Diagnosing and treating preeclampsia early is very important.  Keep all prenatal and follow-up visits. This is important.  Get help right away if you have a seizure, shortness of breath or trouble breathing, trouble speaking or slurred speech, chest pain, or fainting.  This information is not intended to replace advice given to you by your health care provider. Make sure you discuss any questions you have with your health care provider.  Document Revised: 08/10/2020 Document Reviewed:  08/10/2020  Elsevier Patient Education  2022 Elsevier Inc.

## 2021-11-27 NOTE — Progress Notes (Signed)
Subjective:  Madison Hood is a 30 y.o. [id number] at [redacted]w[redacted]d being seen today for ongoing prenatal care.  She is currently monitored for the following issues for this high-risk pregnancy and has Major depressive disorder, single episode, mild (HCC); Anxiety; Abnormal Pap smear of cervix  09/06/20 neg HPV +; Supervision of other normal pregnancy, antepartum; Constipation during pregnancy in second trimester; COVID-19 affecting pregnancy in second trimester; and Mild pre-eclampsia on their problem list.  Patient reports general discomforts of pregnancy.  Contractions: Not present. Vag. Bleeding: None.  Movement: (!) Decreased. Denies leaking of fluid.   The following portions of the patient's history were reviewed and updated as appropriate: allergies, current medications, past family history, past medical history, past social history, past surgical history and problem list. Problem list updated.  Objective:   Vitals:   11/27/21 1425 11/27/21 1426  BP: (!) 146/104 (!) 144/102  Pulse: 64   Weight: 211 lb (95.7 kg)     Fetal Status:     Movement: (!) Decreased     General:  Alert, oriented and cooperative. Patient is in no acute distress.  Skin: Skin is warm and dry. No rash noted.   Cardiovascular: Normal heart rate noted  Respiratory: Normal respiratory effort, no problems with respiration noted  Abdomen: Soft, gravid, appropriate for gestational age. Pain/Pressure: Present     Pelvic:  Cervical exam deferred        Extremities: Normal range of motion.  Edema: Deep pitting, indentation remains for a short time  Mental Status: Normal mood and affect. Normal behavior. Normal judgment and thought content.   Urinalysis:      Assessment and Plan:  Pregnancy: G4P1021 at [redacted]w[redacted]d  1. Supervision of other normal pregnancy, antepartum Stable  2. Mild pre-eclampsia in third trimester Now with severe features by BP  Will [facility] to [facility] magnesium, BMZ and IOL as indicated [facility]  attending made aware  POC reviewed with pt.  Preterm labor symptoms and general obstetric precautions including but not limited to vaginal bleeding, contractions, leaking of fluid and fetal movement were reviewed in detail with the patient. Please refer to After Visit Summary for other counseling recommendations.  No follow-ups on file.   Hermina Staggers, MD

## 2021-11-27 NOTE — Progress Notes (Signed)
Pt had BP taken manually and with machine. BP R arm 146/104 and L arm 144/102. With machine BP was 147/96. BPP scheduled for Thursday. 4+ protein on urine dip in office.

## 2021-11-28 ENCOUNTER — Inpatient Hospital Stay (HOSPITAL_BASED_OUTPATIENT_CLINIC_OR_DEPARTMENT_OTHER): Payer: Self-pay

## 2021-11-28 ENCOUNTER — Telehealth: Payer: Self-pay

## 2021-11-28 DIAGNOSIS — Z3A34 34 weeks gestation of pregnancy: Secondary | ICD-10-CM

## 2021-11-28 DIAGNOSIS — O1403 Mild to moderate pre-eclampsia, third trimester: Secondary | ICD-10-CM

## 2021-11-28 IMAGING — US US MFM OB COMP +14 WKS
1 series · 14 of 28 positions shown · non-contrast
Comparison: none

[Series 1: us mfm ob comp +14 wks · 72 acquisitions, 14 frames shown]
[im 3/72]
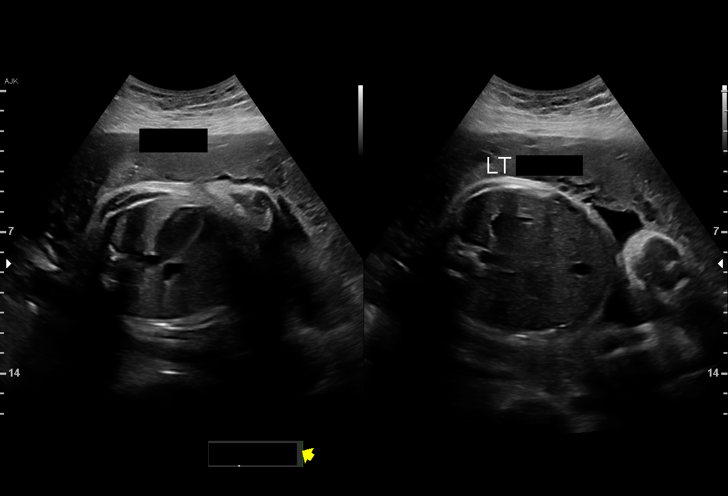
[im 8/72]
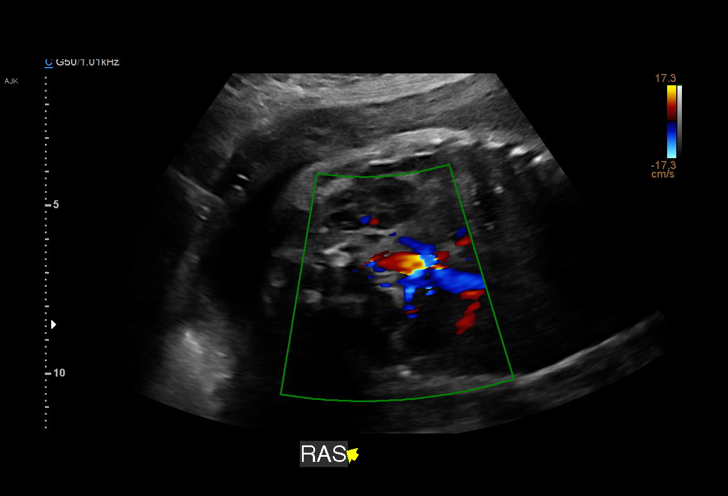
[im 14/72]
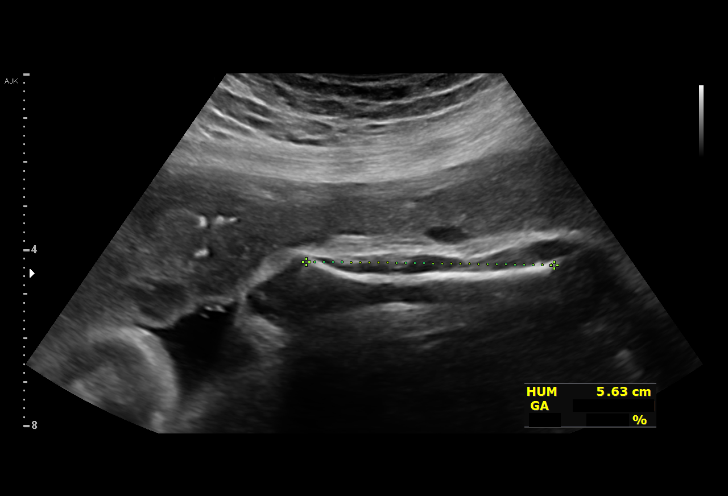
[im 19/72]
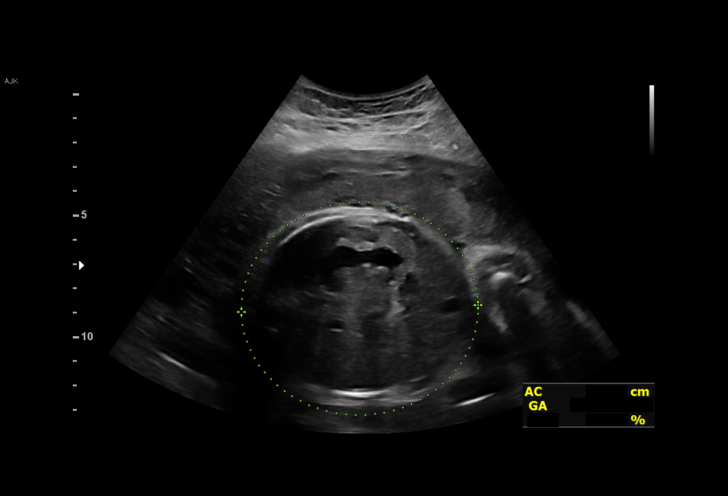
[im 24/72]
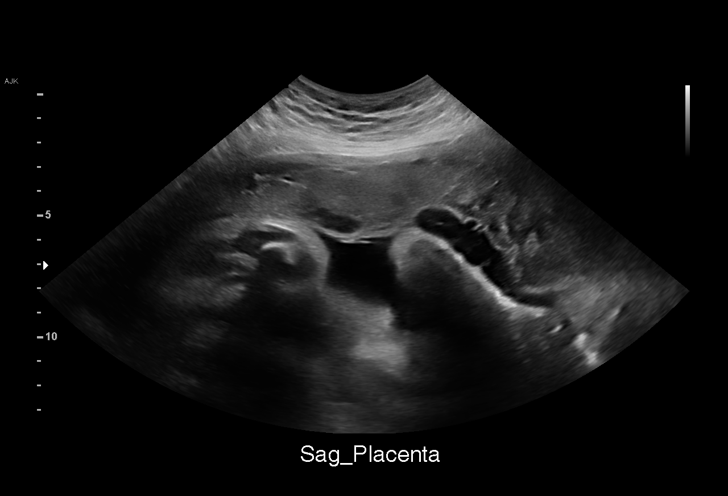
[im 29/72]
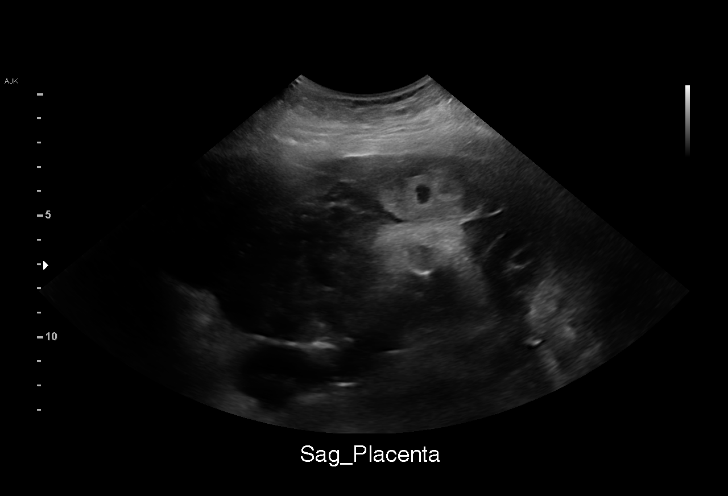
[im 35/72]
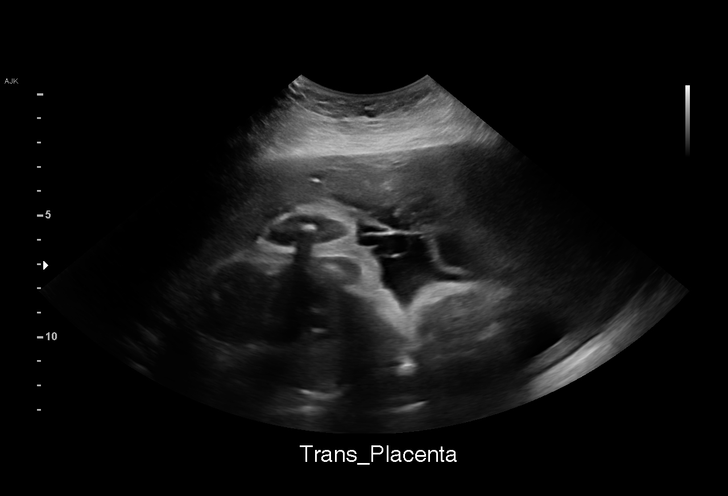
[im 40/72]
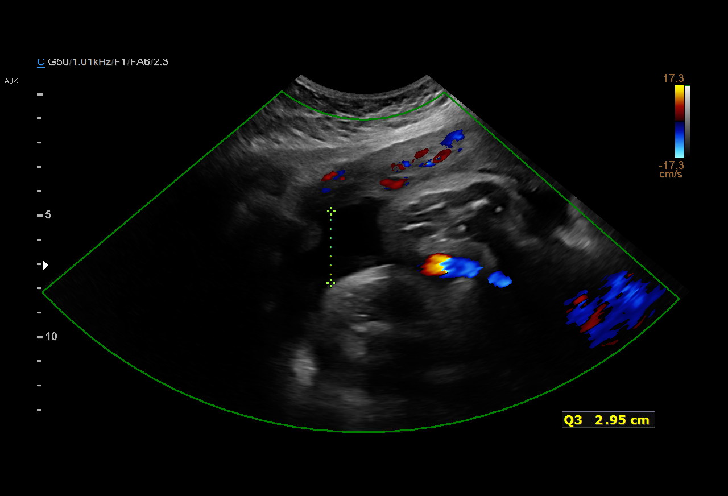
[im 45/72]
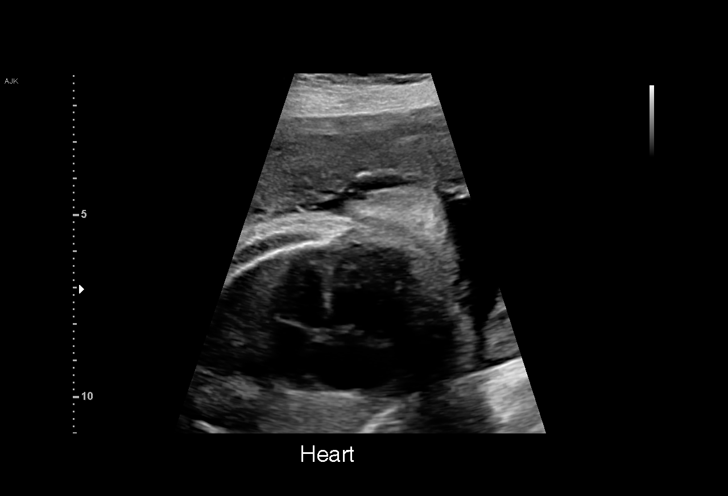
[im 50/72]
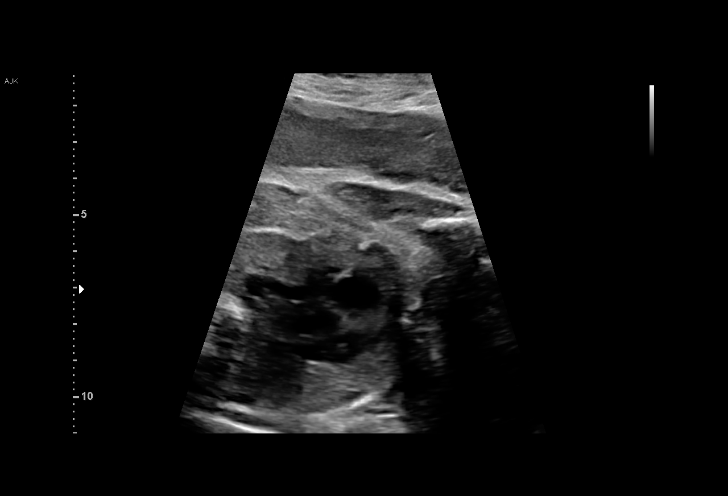
[im 56/72]
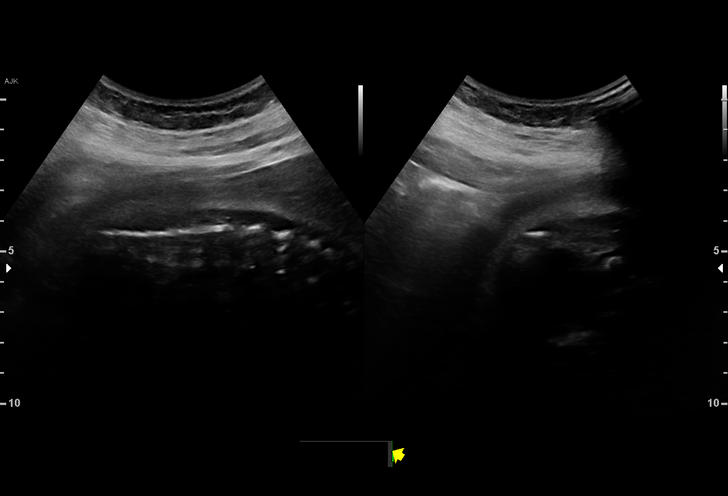
[im 61/72]
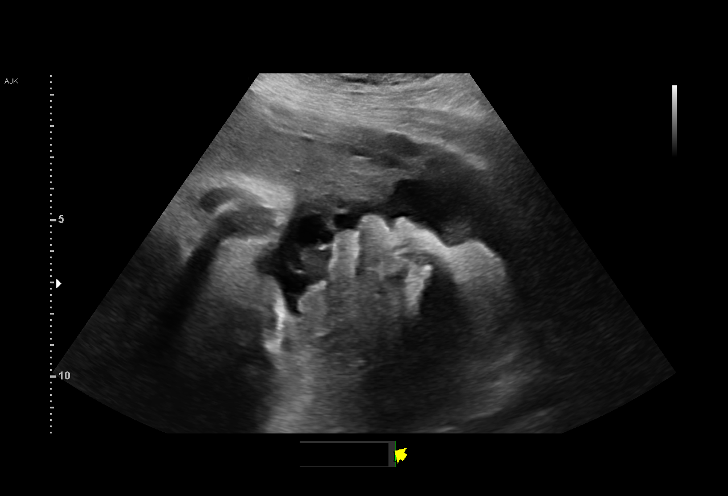
[im 66/72]
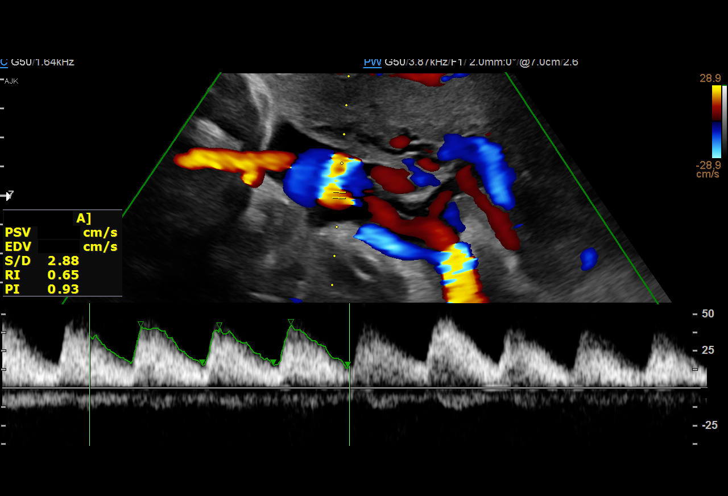
[im 72/72]
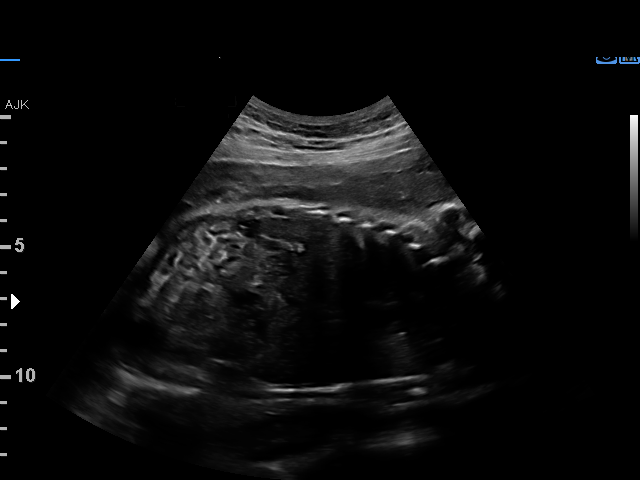

[14 of 28 positions shown; findings below may reference images not displayed]

Indications

 Hypertension - Chronic with superimposed       [6H] [6H]
 preeclampsia
 34 weeks gestation of pregnancy
 Obesity complicating pregnancy                 [6H] [6H]
 Decreased fetal movement                       [6H]
Fetal Evaluation

 Num Of Fetuses:         1
 Fetal Heart Rate(bpm):  138
 Cardiac Activity:       Observed
 Presentation:           Cephalic
 Placenta:               Anterior
 P. Cord Insertion:      Visualized

 Amniotic Fluid
 AFI FV:      Within normal limits

 AFI Sum(cm)     %Tile       Largest Pocket(cm)
 9.2             14          3

 RUQ(cm)       RLQ(cm)       LUQ(cm)        LLQ(cm)
 2.6           1.2           2.4            3
Biophysical Evaluation

 Amniotic F.V:   Within normal limits       F. Tone:        Observed
 F. Movement:    Not Observed               Score:          [DATE]
 F. Breathing:   Observed
Biometry

 BPD:      86.3  mm     G. Age:  34w 6d         54  %    CI:        83.58   %    70 - 86
                                                         FL/HC:      21.7   %    20.1 -
 HC:      297.6  mm     G. Age:  33w 0d        1.2  %    HC/AC:      1.03        0.93 -
 AC:      288.8  mm     G. Age:  32w 6d         10  %    FL/BPD:     74.9   %    71 - 87
 FL:       64.6  mm     G. Age:  33w 2d         12  %    FL/AC:      22.4   %    20 - 24
 HUM:      56.5  mm     G. Age:  32w 6d         25  %

 Est. FW:    [6H]  gm    4 lb 11 oz      11  %
OB History

 Gravidity:    4         Term:   1         SAB:   2
Gestational Age

 LMP:           34w 5d        Date:  [DATE]                 EDD:   [DATE]
 U/S Today:     33w 4d                                        EDD:   [DATE]
 Best:          34w 5d     Det. By:  LMP  ([DATE])          EDD:   [DATE]
Anatomy

 Cranium:               Appears normal         Diaphragm:              Appears normal
 Face:                  Appears normal         Stomach:                Appears normal, left
                        (orbits and profile)                           sided
 Lips:                  Appears normal         Abdomen:                Appears normal
 Thoracic:              Appears normal         Kidneys:                Appear normal
 RVOT:                  Appears normal         Bladder:                Appears normal
 LVOT:                  Appears normal         Spine:                  Ltd views no
                                                                       intracranial signs of
                                                                       NTD
Comments

 This patient has been admitted due to mild preeclampsia.
 The overall EFW was 4 pounds 11 ounces (11th percentile).
 There was normal amniotic fluid noted.
 A biophysical profile performed today was [DATE].  She
 received a -2 for fetal breathing movements that did not meet
 criteria.  She will have an NST performed.

## 2021-11-28 MED ORDER — LABETALOL HCL 200 MG PO TABS
200.0000 mg | ORAL_TABLET | Freq: Two times a day (BID) | ORAL | Status: DC
  Administered 2021-11-28: 13:00:00 200 mg via ORAL
  Filled 2021-11-28: qty 1

## 2021-11-28 MED ORDER — LABETALOL HCL 200 MG PO TABS: 200.0000 mg | ORAL_TABLET | Freq: Two times a day (BID) | ORAL | 1 refills | Status: DC

## 2021-11-28 NOTE — Progress Notes (Signed)
FACULTY PRACTICE ANTEPARTUM PROGRESS NOTE  Madison Hood is a 30 y.o. [id number] at [redacted]w[redacted]d who is admitted for elevated blood pressure and mild preeclampsia with exacerbation in outpatient setting..  Estimated Date of Delivery: 01/04/22 Fetal presentation is cephalic.  Length of Stay:  1 Days. Admitted 11/27/2021  Subjective: Pt doing well.  No acute complaints. Patient reports decreased fetal movement.  She denies uterine contractions, denies bleeding and leaking of fluid per vagina.  Vitals:  Blood pressure (!) 150/92, pulse 60, temperature 97.6 F (36.4 C), temperature source Oral, resp. rate 17, height 5\' 7"  (1.702 m), weight 95.7 kg, last menstrual period 03/30/2021, SpO2 100 %. Physical Examination: CONSTITUTIONAL: Well-developed, well-nourished female in no acute distress.  HENT:  Normocephalic, atraumatic, External right and left ear normal. Oropharynx is clear and moist EYES: Conjunctivae and EOM are normal.  NECK: Normal range of motion, supple, no masses. SKIN: Skin is warm and dry. No rash noted. Not diaphoretic. No erythema. No pallor. NEUROLGIC: Alert and oriented to person, place, and time. Normal reflexes, muscle tone coordination. No cranial nerve deficit noted. PSYCHIATRIC: Normal mood and affect. Normal behavior. Normal judgment and thought content. CARDIOVASCULAR: Normal heart rate noted, regular rhythm RESPIRATORY: Effort and breath sounds normal, no problems with respiration noted MUSCULOSKELETAL: Normal range of motion. No edema and no tenderness. ABDOMEN: Soft, nontender, nondistended, gravid. CERVIX: deferred  Fetal monitoring:pending  Results for orders placed or performed during the hospital encounter of 11/27/21 (from the past 48 hour(s))  Resp Panel by RT-PCR (Flu A&B, Covid) Nasopharyngeal Swab     Status: None   Collection Time: 11/27/21  4:57 PM   Specimen: Nasopharyngeal Swab; Nasopharyngeal(NP) swabs in vial transport medium  Result Value Ref Range    SARS Coronavirus 2 by RT PCR NEGATIVE NEGATIVE    Comment: (NOTE) SARS-CoV-2 target nucleic acids are NOT DETECTED.  The SARS-CoV-2 RNA is generally detectable in upper respiratory specimens during the acute phase of infection. The lowest concentration of SARS-CoV-2 viral copies this assay can detect is 138 copies/mL. A negative result does not preclude SARS-Cov-2 infection and should not be used as the sole basis for treatment or other patient management decisions. A negative result may occur with  improper specimen collection/handling, submission of specimen other than nasopharyngeal swab, presence of viral mutation(s) within the areas targeted by this assay, and inadequate number of viral copies(<138 copies/mL). A negative result must be combined with clinical observations, patient history, and epidemiological information. The expected result is Negative.  Fact Sheet for Patients:  11/29/21  Fact Sheet for Healthcare Providers:  BloggerCourse.com  This test is no t yet approved or cleared by the [employer] FDA and  has been authorized for detection and/or diagnosis of SARS-CoV-2 by FDA under an Emergency Use Authorization (EUA). This EUA will remain  in effect (meaning this test can be used) for the duration of the COVID-19 declaration under Section 564(b)(1) of the Act, 21 U.S.C.section 360bbb-3(b)(1), unless the authorization is terminated  or revoked sooner.       Influenza A by PCR NEGATIVE NEGATIVE   Influenza B by PCR NEGATIVE NEGATIVE    Comment: (NOTE) The Xpert Xpress SARS-CoV-2/FLU/RSV plus assay is intended as an aid in the diagnosis of influenza from Nasopharyngeal swab specimens and should not be used as a sole basis for treatment. Nasal washings and aspirates are unacceptable for Xpert Xpress SARS-CoV-2/FLU/RSV testing.  Fact Sheet for Patients: [place]  Fact  Sheet for Healthcare Providers: BloggerCourse.com  This test is not yet  approved or cleared by the [place] and has been authorized for detection and/or diagnosis of SARS-CoV-2 by FDA under an Emergency Use Authorization (EUA). This EUA will remain in effect (meaning this test can be used) for the duration of the COVID-19 declaration under Section 564(b)(1) of the Act, 21 U.S.C. section 360bbb-3(b)(1), unless the authorization is terminated or revoked.  Performed at [facility], [address] [place], [place] [address]   Type and screen [facility]     Status: None   Collection Time: 11/27/21  7:07 PM  Result Value Ref Range   ABO/RH(D) A POS    Antibody Screen NEG    Sample Expiration      11/30/2021,2359 Performed at [facility], [address] [place], [place] [address]   Comprehensive metabolic panel     Status: Abnormal   Collection Time: 11/27/21  7:07 PM  Result Value Ref Range   Sodium 135 135 - 145 mmol/L   Potassium 3.8 3.5 - 5.1 mmol/L   Chloride 109 98 - 111 mmol/L   CO2 20 (L) 22 - 32 mmol/L   Glucose, Bld 77 70 - 99 mg/dL    Comment: Glucose reference range applies only to samples taken after fasting for at least 8 hours.   BUN 9 6 - 20 mg/dL   Creatinine, Ser 6.76 0.44 - 1.00 mg/dL   Calcium 8.0 (L) 8.9 - 10.3 mg/dL   Total Protein 4.7 (L) 6.5 - 8.1 g/dL   Albumin 2.1 (L) 3.5 - 5.0 g/dL   AST 24 15 - 41 U/L   ALT 15 0 - 44 U/L   Alkaline Phosphatase 92 38 - 126 U/L   Total Bilirubin 0.5 0.3 - 1.2 mg/dL   GFR, Estimated >19 >50 mL/min    Comment: (NOTE) Calculated using the CKD-EPI Creatinine Equation (2021)    Anion gap 6 5 - 15    Comment: Performed at [facility], [address] [place], [place] [address]  CBC     Status: Abnormal   Collection Time: 11/27/21  7:07 PM  Result Value Ref Range   WBC 7.7 4.0 - 10.5 K/uL   RBC 3.19 (L) 3.87 - 5.11 MIL/uL   Hemoglobin 10.1 (L)  12.0 - 15.0 g/dL   HCT 12.4 (L) 58.0 - 99.8 %   MCV 90.3 80.0 - 100.0 fL   MCH 31.7 26.0 - 34.0 pg   MCHC 35.1 30.0 - 36.0 g/dL   RDW 33.8 25.0 - 53.9 %   Platelets 225 150 - 400 K/uL   nRBC 0.0 0.0 - 0.2 %    Comment: Performed at [facility], [address], [place], [place] [address]    I have reviewed the patient's current medications.  ASSESSMENT: Principal Problem:   Chronic hypertension with superimposed preeclampsia Active Problems:   Mild pre-eclampsia   PLAN: NST today Monitor BP, if continues to have mild range consider outpatient management   Continue routine antenatal care.   Mariel Aloe, MD [facility] Faculty Attending, Center for [facility] 11/28/2021 7:55 AM

## 2021-11-28 NOTE — Discharge Summary (Signed)
Physician Discharge Summary  Patient ID: Madison Hood MRN: [id number] DOB/AGE: 31-Apr-1992 31 y.o.  Admit date: 11/27/2021 Discharge date: 11/28/2021  Admission Diagnoses:Pre eclampsia, known, severe range pressures in the office  Discharge Diagnoses:  Principal Problem:   Chronic hypertension with superimposed preeclampsia Active Problems:   Mild pre-eclampsia   Discharged Condition: stable, pre eclampsia mild  Hospital Course: pt was admitted with concerns over BP but all of her BP in the hospital have been mild range, although definitely diagnostic of pre eclampsia without severe features, labs are normal/stable and pt is asymptomatic.  Discharged on labetalol 200 BID to bring pressure down a bit and recommend delivery at [redacted]w[redacted]d, 12/14/21  Consults: None  Significant Diagnostic Studies: labs: + sonogram  Results for orders placed or performed during the hospital encounter of 11/27/21 (from the past 24 hour(s))  Resp Panel by RT-PCR (Flu A&B, Covid) Nasopharyngeal Swab     Status: None   Collection Time: 11/27/21  4:57 PM   Specimen: Nasopharyngeal Swab; Nasopharyngeal(NP) swabs in vial transport medium  Result Value Ref Range   SARS Coronavirus 2 by RT PCR NEGATIVE NEGATIVE   Influenza A by PCR NEGATIVE NEGATIVE   Influenza B by PCR NEGATIVE NEGATIVE  Type and screen [facility]     Status: None   Collection Time: 11/27/21  7:07 PM  Result Value Ref Range   ABO/RH(D) A POS    Antibody Screen NEG    Sample Expiration      11/30/2021,2359 Performed at [facility], [address] [place], [place] [address]   Comprehensive metabolic panel     Status: Abnormal   Collection Time: 11/27/21  7:07 PM  Result Value Ref Range   Sodium 135 135 - 145 mmol/L   Potassium 3.8 3.5 - 5.1 mmol/L   Chloride 109 98 - 111 mmol/L   CO2 20 (L) 22 - 32 mmol/L   Glucose, Bld 77 70 - 99 mg/dL   BUN 9 6 - 20 mg/dL   Creatinine, Ser 8.27 0.44 - 1.00 mg/dL    Calcium 8.0 (L) 8.9 - 10.3 mg/dL   Total Protein 4.7 (L) 6.5 - 8.1 g/dL   Albumin 2.1 (L) 3.5 - 5.0 g/dL   AST 24 15 - 41 U/L   ALT 15 0 - 44 U/L   Alkaline Phosphatase 92 38 - 126 U/L   Total Bilirubin 0.5 0.3 - 1.2 mg/dL   GFR, Estimated >07 >86 mL/min   Anion gap 6 5 - 15  CBC     Status: Abnormal   Collection Time: 11/27/21  7:07 PM  Result Value Ref Range   WBC 7.7 4.0 - 10.5 K/uL   RBC 3.19 (L) 3.87 - 5.11 MIL/uL   Hemoglobin 10.1 (L) 12.0 - 15.0 g/dL   HCT 75.4 (L) 49.2 - 01.0 %   MCV 90.3 80.0 - 100.0 fL   MCH 31.7 26.0 - 34.0 pg   MCHC 35.1 30.0 - 36.0 g/dL   RDW 07.1 21.9 - 75.8 %   Platelets 225 150 - 400 K/uL   nRBC 0.0 0.0 - 0.2 %     Treatments: observation BP management  Discharge Exam: Blood pressure 133/81, pulse 67, temperature 98.1 F (36.7 C), temperature source Oral, resp. rate 18, height 5\' 7"  (1.702 m), weight 95.7 kg, last menstrual period 03/30/2021, SpO2 99 %. General appearance: alert, cooperative, and no distress GI: soft, non-tender; bowel sounds normal; no masses,  no organomegaly  Disposition: Discharge disposition:  01-Home or Self Care       Discharge Instructions     Call MD for:  persistant nausea and vomiting   Complete by: As directed    Call MD for:  severe uncontrolled pain   Complete by: As directed    Call MD for:  temperature >100.4   Complete by: As directed    Diet - low sodium heart healthy   Complete by: As directed    Increase activity slowly   Complete by: As directed       Allergies as of 11/28/2021   No Known Allergies      Medication List     TAKE these medications    labetalol 200 MG tablet Commonly known as: NORMODYNE Take 1 tablet (200 mg total) by mouth 2 (two) times daily.   prenatal multivitamin Tabs tablet Take 1 tablet by mouth daily at 12 noon.        Follow-up Information     [facility] for [facility] Follow up on 12/04/2021.   Specialty:  Obstetrics and Gynecology Why: antepartum visit + NST Contact information: [address] [place] [address] [phone]                Signed: Lazaro Arms 11/28/2021, 3:52 PM

## 2021-11-28 NOTE — Progress Notes (Signed)
[id number]- Patient discharged with significant other, verbalizes understanding on how to take BP medication, s/s pre-e reviewed, f/u appointment on Tuesday for ultrasound and BP check.

## 2021-11-28 NOTE — Telephone Encounter (Signed)
Left message - patient does not need to come to ultrasound appointment Thursday 11/29/21 - patient had a BPP ultrasound at [facility] Wednesday 11/28/21  BPP not needed 11/29/21

## 2021-11-29 ENCOUNTER — Other Ambulatory Visit: Payer: Self-pay

## 2021-11-29 ENCOUNTER — Encounter: Payer: Self-pay | Admitting: Obstetrics and Gynecology

## 2021-11-29 ENCOUNTER — Ambulatory Visit: Payer: Self-pay

## 2021-12-02 NOTE — L&D Delivery Note (Signed)
Delivery Note Madison Hood is a 32 y.o. [id number] at [redacted]w[redacted]d admitted for IOL for cHTN w/ SIPE w/o SF.   GBS Status: Unknown   Maximum Maternal Temperature: 97.9  Labor course: Initial SVE: 0/30/-2. Augmentation with: Cytotec. She then progressed to complete.  ROM: 1h 96m with clearish brown fluid  Birth: At 101 a viable female was delivered via spontaneous vaginal delivery (Presentation: LOA ). Nuchal cord present: Yes, reduced with somersault maneuver.  Shoulders and body delivered in usual fashion. Infant placed directly on mom's abdomen for bonding/skin-to-skin, baby dried and stimulated. Cord clamped x 2 after 3 minutes and cut by FOB.  Cord blood collected. Pitocin was infused rapidly IV per protocol. Patient was noted to have some trickling of blood and placenta was attempted to be removed by applying fundal protection and careful cord traction but did not detach. At this time, bleeding was noted to increase and TXA was started. [name], CNM took over to attempt placental delivery with cord traction and fundal protection without success. Bleeding was noted to continue and became more brisk; thus Dr. Lauretta Chester was called to the bedside to assess for retained products. At this time, EBL was 400 cc. Dr. Lauretta Chester presented to the bedside and took over with placental extraction manually. The placenta continued to be adherent to the uterus and was eventually extracted manually, but was not intact with trailing membranes, which were retrieved with ring forceps. Dr. Ernestina Patches continued to sweep the uterus and extract portions of placenta with membranes and small to large blood clots. Fundal massage revealed small pieces of retained products, clots and persistent bleeding. Given ongoing brisk bleeding, Code Hemorrhage was called. She was noted to have an EBL of 2875 cc, was symptomatic with hypotension and lightheadedness. In the setting of retained products, patient was rapidly  transported to the OR for stabilization. Please see OR note for remainder of patient's delivery course.   Sponge and instrument count were correct x2.  Intrapartum complications:  RPOC, D&E and Postpartum Hemorrhage Anesthesia:  epidural Episiotomy: none Lacerations:  none Suture Repair:  n/a EBL (mL): 2875 cc prior to transfer to OR   Infant: APGAR (1 MIN):  pending APGAR (5 MINS):  pending APGAR (10 MINS):    Infant weight: pending  Mom to OR.  Baby to Nursery. Placenta to L&D   Plans to Breastfeed Contraception: IUD Circumcision: N/A  Note sent to [facility]: [facility] for pp visit.  [name], MD 12/14/2021 4:52 PM

## 2021-12-04 ENCOUNTER — Other Ambulatory Visit: Payer: Self-pay

## 2021-12-04 ENCOUNTER — Ambulatory Visit (INDEPENDENT_AMBULATORY_CARE_PROVIDER_SITE_OTHER): Payer: Self-pay | Admitting: Family Medicine

## 2021-12-04 VITALS — BP 136/90 | HR 66 | Wt 205.0 lb

## 2021-12-04 DIAGNOSIS — Z3480 Encounter for supervision of other normal pregnancy, unspecified trimester: Secondary | ICD-10-CM

## 2021-12-04 DIAGNOSIS — O119 Pre-existing hypertension with pre-eclampsia, unspecified trimester: Secondary | ICD-10-CM

## 2021-12-04 NOTE — Progress Notes (Signed)
° °  PRENATAL VISIT NOTE  Subjective:  Madison Hood is a 31 y.o. [id number] at [redacted]w[redacted]d being seen today for ongoing prenatal care.  She is currently monitored for the following issues for this high-risk pregnancy and has Major depressive disorder, single episode, mild ([place]); Anxiety; Abnormal Pap smear of cervix  09/06/20 neg HPV +; Supervision of other normal pregnancy, antepartum; Constipation during pregnancy in second trimester; COVID-19 affecting pregnancy in second trimester; Mild pre-eclampsia; and Chronic hypertension with superimposed preeclampsia on their problem list.  Patient reports no complaints. Denies headache, floaters or spots in vision or RUQ pain. C/o LE edema. BP at home is 140-150/80-90. Contractions: Not present. Vag. Bleeding: None.  Movement: Present. Denies leaking of fluid.   The following portions of the patient's history were reviewed and updated as appropriate: allergies, current medications, past family history, past medical history, past social history, past surgical history and problem list.   Objective:   Vitals:   12/04/21 1059 12/04/21 1107  BP: (!) 148/97 136/90  Pulse: 66   Weight: 205 lb (93 kg)     Fetal Status: Fetal Heart Rate (bpm): 155 Fundal Height: 32 cm Movement: Present  Presentation: Vertex  General:  Alert, oriented and cooperative. Patient is in no acute distress.  Skin: Skin is warm and dry. No rash noted.   Cardiovascular: Normal heart rate noted  Respiratory: Normal respiratory effort, no problems with respiration noted  Abdomen: Soft, gravid, appropriate for gestational age.  Pain/Pressure: Present     Pelvic: Cervical exam deferred        Extremities: Normal range of motion.  Edema: Deep pitting, indentation remains for a short time  Mental Status: Normal mood and affect. Normal behavior. Normal judgment and thought content.   Assessment and Plan:  Pregnancy: G4P1021 at [redacted]w[redacted]d 1. Chronic hypertension with superimposed  preeclampsia Labs today BPP this week IOL at 37 weeks if not sooner based on symptoms, labs, testing Continue meds for now - CBC - Comprehensive metabolic panel - TSH - Protein / creatinine ratio, urine  2. Supervision of high risk pregnancy, antepartum Will need cultures next week  Preterm labor symptoms and general obstetric precautions including but not limited to vaginal bleeding, contractions, leaking of fluid and fetal movement were reviewed in detail with the patient. Please refer to After Visit Summary for other counseling recommendations.   Return in about 1 week (around 12/11/2021) for needs BPP this week with D. Day or MFM, OB visit and BPP, HRC.  Future Appointments  Date Time Provider [place]  12/11/2021  2:10 PM Radene Gunning, MD [place] None  12/14/2021 10:15 AM [facility] NURSE [facility]  12/14/2021 10:30 AM [facility] US3 [facility]    Madison Jude, MD

## 2021-12-04 NOTE — Progress Notes (Signed)
Pt states she has been having blurry vision the last few days. Pt states BP at home has been 150's/90's. Pt is taking meds as given.  Pt states she has noticed her heart "racing" at night when lying down.  Pt complains of increase in nausea.   Pt states she was advised to be delivered at 37 weeks.

## 2021-12-05 ENCOUNTER — Other Ambulatory Visit: Payer: Self-pay

## 2021-12-05 DIAGNOSIS — R7989 Other specified abnormal findings of blood chemistry: Secondary | ICD-10-CM

## 2021-12-05 LAB — COMPREHENSIVE METABOLIC PANEL
ALT: 16 IU/L (ref 0–32)
AST: 29 IU/L (ref 0–40)
Albumin/Globulin Ratio: 1.4 (ref 1.2–2.2)
Albumin: 3 g/dL — ABNORMAL LOW (ref 3.8–4.8)
Alkaline Phosphatase: 121 IU/L (ref 44–121)
BUN/Creatinine Ratio: 15 (ref 9–23)
BUN: 10 mg/dL (ref 6–20)
Bilirubin Total: 0.3 mg/dL (ref 0.0–1.2)
CO2: 18 mmol/L — ABNORMAL LOW (ref 20–29)
Calcium: 8.3 mg/dL — ABNORMAL LOW (ref 8.7–10.2)
Chloride: 107 mmol/L — ABNORMAL HIGH (ref 96–106)
Creatinine, Ser: 0.66 mg/dL (ref 0.57–1.00)
Globulin, Total: 2.1 g/dL (ref 1.5–4.5)
Glucose: 66 mg/dL — ABNORMAL LOW (ref 70–99)
Potassium: 4 mmol/L (ref 3.5–5.2)
Sodium: 137 mmol/L (ref 134–144)
Total Protein: 5.1 g/dL — ABNORMAL LOW (ref 6.0–8.5)
eGFR: 120 mL/min/{1.73_m2} (ref >59)

## 2021-12-05 LAB — CBC
Hematocrit: 31 % — ABNORMAL LOW (ref 34.0–46.6)
Hemoglobin: 10.2 g/dL — ABNORMAL LOW (ref 11.1–15.9)
MCH: 29.1 pg (ref 26.6–33.0)
MCHC: 32.9 g/dL (ref 31.5–35.7)
MCV: 89 fL (ref 79–97)
Platelets: 248 10*3/uL (ref 150–450)
RBC: 3.5 x10E6/uL — ABNORMAL LOW (ref 3.77–5.28)
RDW: 12.5 % (ref 11.7–15.4)
WBC: 7.7 10*3/uL (ref 3.4–10.8)

## 2021-12-05 LAB — TSH: TSH: 4.78 u[IU]/mL — ABNORMAL HIGH (ref 0.450–4.500)

## 2021-12-05 NOTE — Progress Notes (Signed)
Patient was assessed and managed by nursing staff during this encounter. I have reviewed the chart and agree with the documentation and plan. I have also made any necessary editorial changes.  Scheryl Darter, MD 12/05/2021 10:40 AM

## 2021-12-06 ENCOUNTER — Ambulatory Visit: Payer: Self-pay | Admitting: *Deleted

## 2021-12-06 ENCOUNTER — Other Ambulatory Visit: Payer: Self-pay

## 2021-12-06 ENCOUNTER — Ambulatory Visit (INDEPENDENT_AMBULATORY_CARE_PROVIDER_SITE_OTHER): Payer: Self-pay

## 2021-12-06 VITALS — BP 138/94 | HR 75

## 2021-12-06 DIAGNOSIS — O119 Pre-existing hypertension with pre-eclampsia, unspecified trimester: Secondary | ICD-10-CM

## 2021-12-06 LAB — T4, FREE: Free T4: 1.02 ng/dL (ref 0.82–1.77)

## 2021-12-06 LAB — SPECIMEN STATUS REPORT

## 2021-12-06 LAB — PROTEIN / CREATININE RATIO, URINE
Creatinine, Urine: 267.6 mg/dL
Protein, Ur: 828.7 mg/dL
Protein/Creat Ratio: 3097 mg/g creat — ABNORMAL HIGH (ref 0–200)

## 2021-12-06 IMAGING — US US FETAL BPP W/ NON-STRESS
1 series · 14 of 14 positions shown · non-contrast
Comparison: none

[Series 1: us fetal bpp w/ non-stress · 14 acquisitions, 14 frames shown]
[im 1/14]
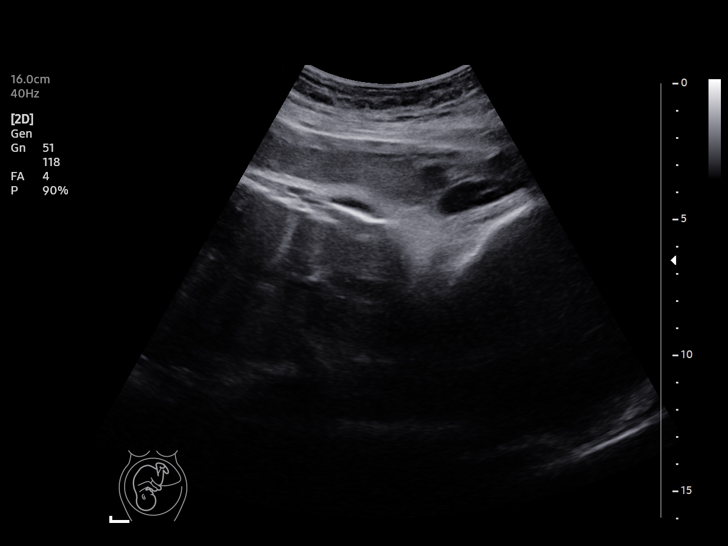
[im 2/14]
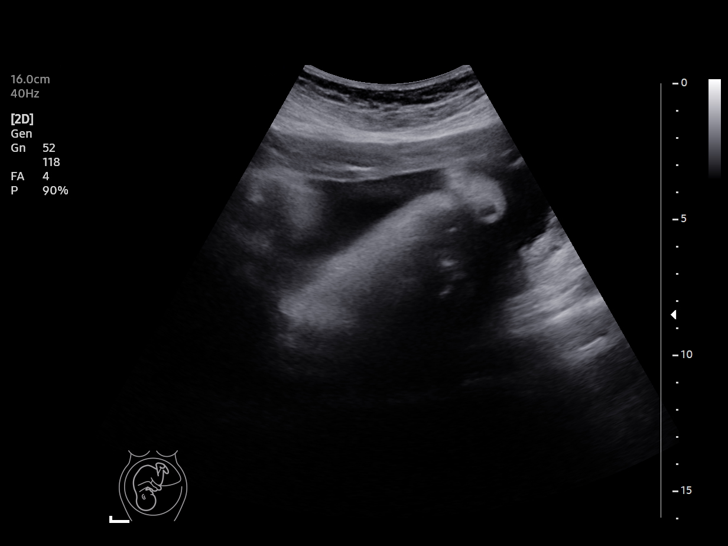
[im 3/14]
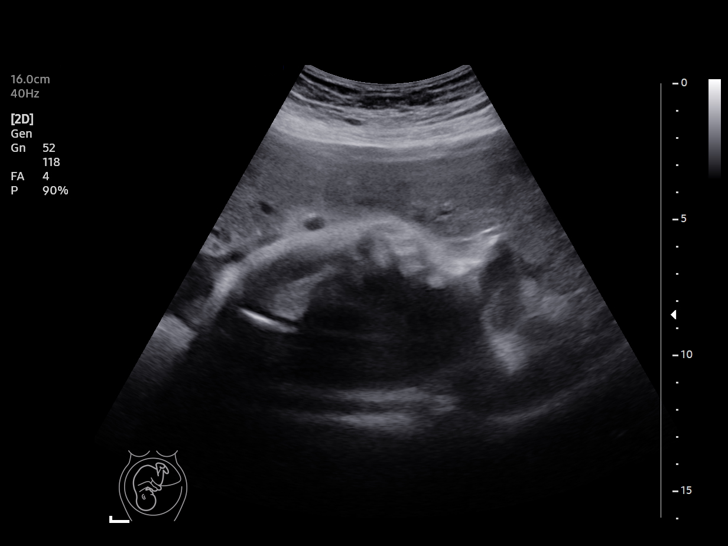
[im 4/14]
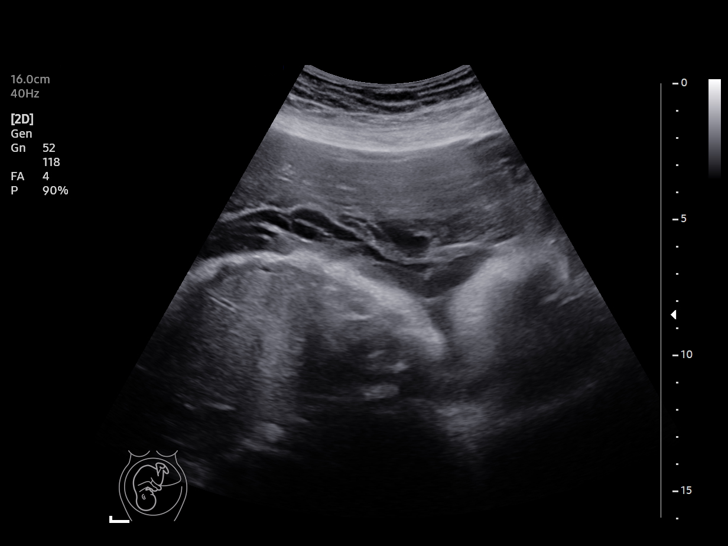
[im 5/14]
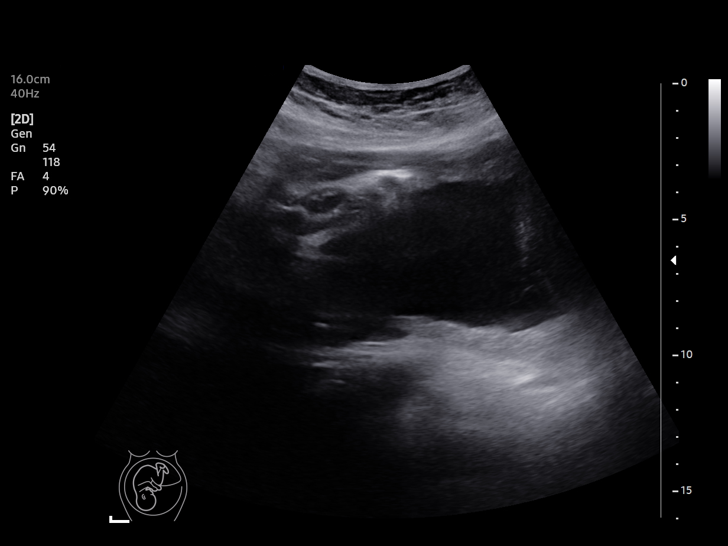
[im 6/14]
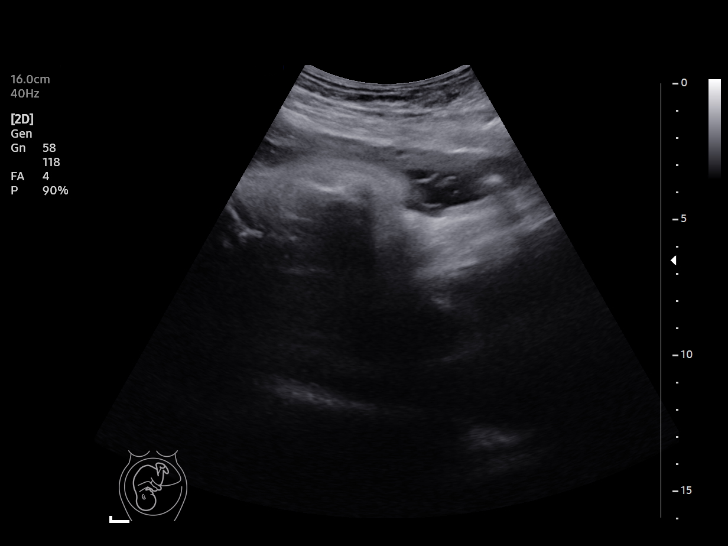
[im 7/14]
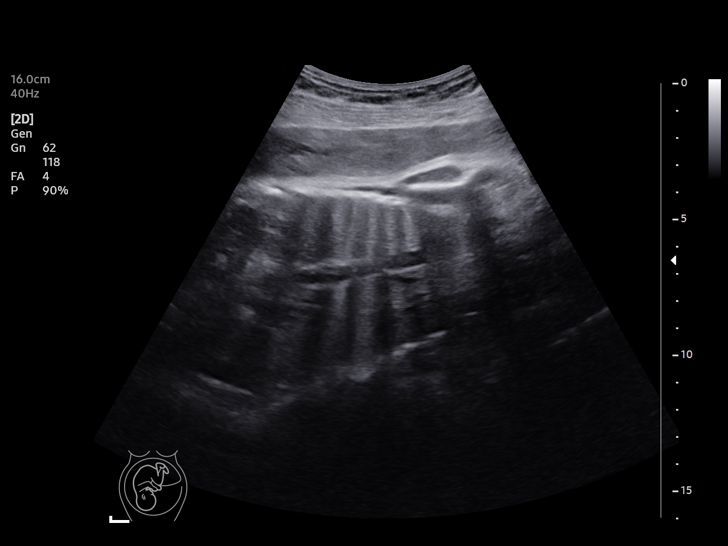
[im 8/14]
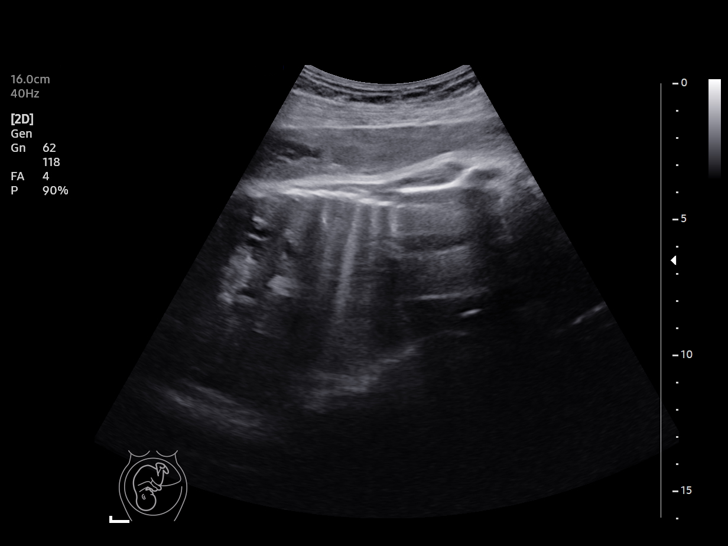
[im 9/14]
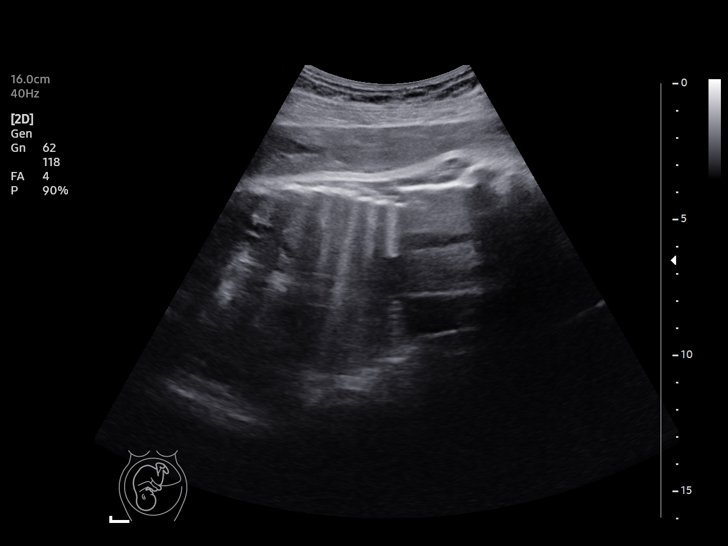
[im 10/14]
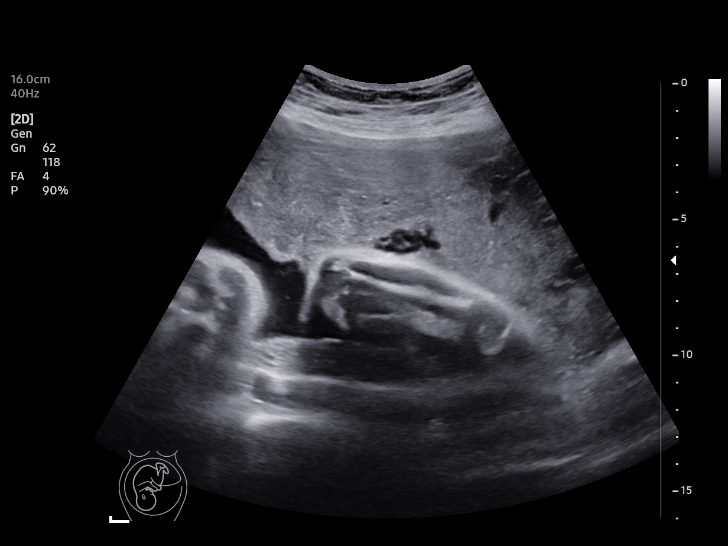
[im 11/14]
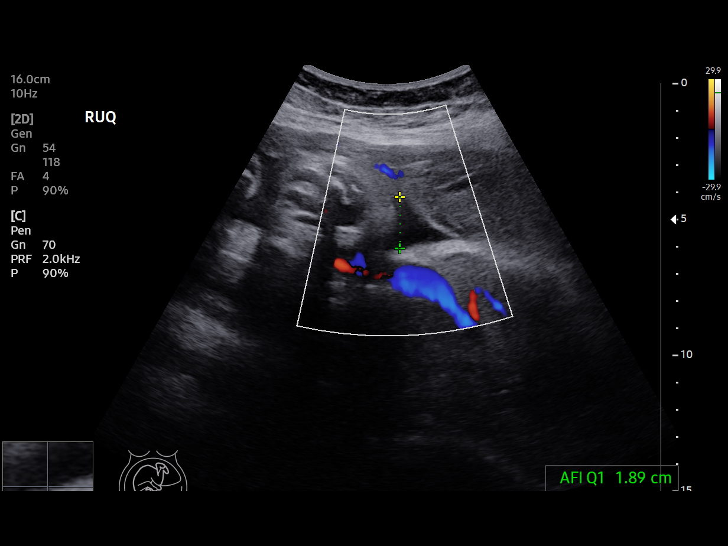
[im 12/14]
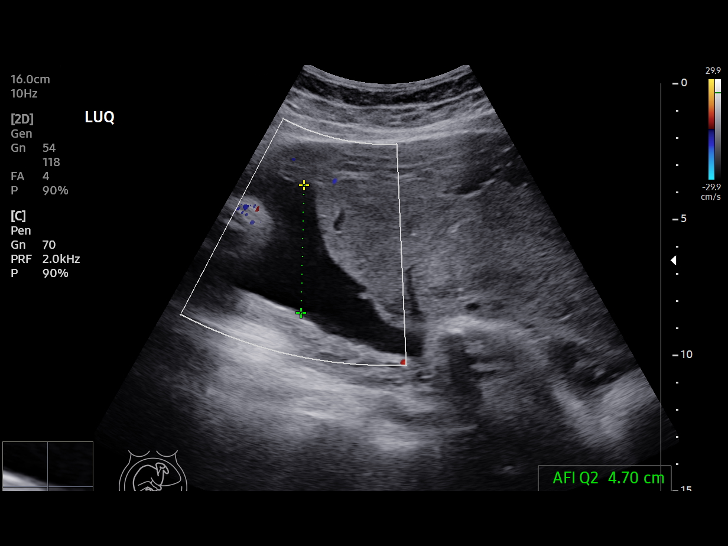
[im 13/14]
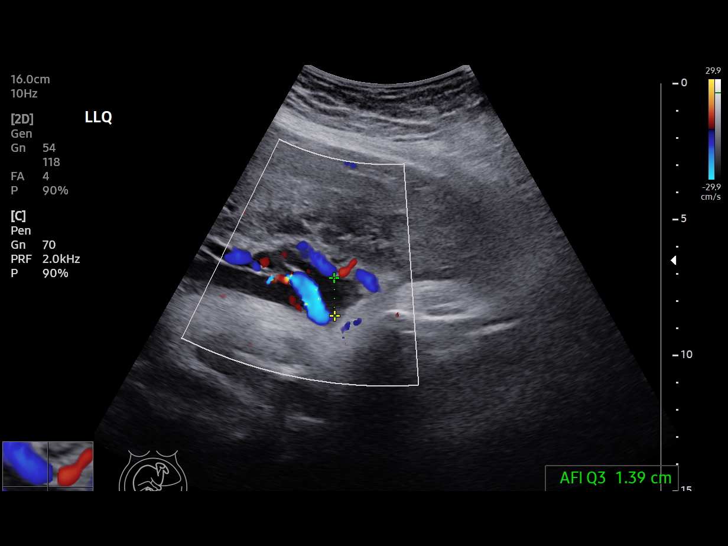
[im 14/14]
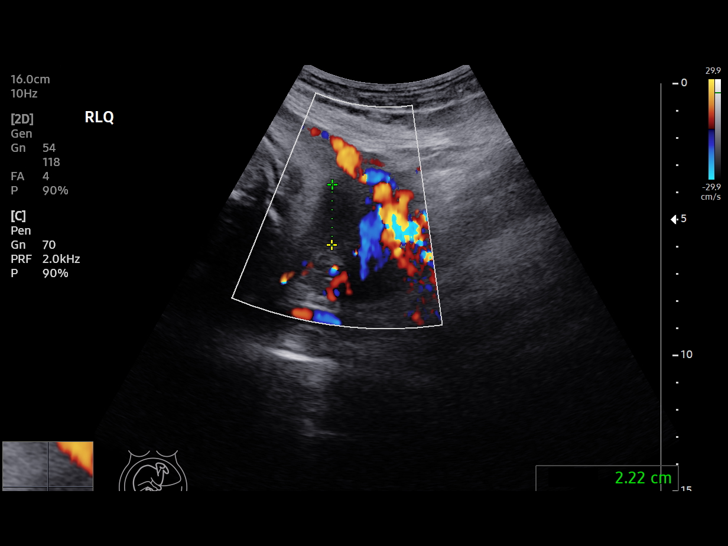

[14 of 14 positions shown; findings below may reference images not displayed]

[REDACTED]care at

 1  US FETAL BPP W/NONSTRESS              76818.4     OHLAVRAC

Service(s) Provided

Indications

 35 weeks gestation of pregnancy
 Pre-existing hypertension with preeclampsia,    [NK]
 third trimester
Fetal Evaluation

 Num Of Fetuses:          1
 Preg. Location:          Intrauterine
 Cardiac Activity:        Observed
 Presentation:            Cephalic

 AFI Sum(cm)     %Tile       Largest Pocket(cm)
 10.2            23

 RUQ(cm)       RLQ(cm)       LUQ(cm)        LLQ(cm)

Biophysical Evaluation

 Amniotic F.V:   Pocket => 2 cm             F. Tone:         Observed
 F. Movement:    Observed                   N.S.T:           Reactive
 F. Breathing:   Observed                   Score:           [DATE]
OB History

 Gravidity:    4         Term:   1         SAB:   2
Gestational Age

 LMP:           35w 6d        Date:  [DATE]                 EDD:   [DATE]
 Best:          35w 6d     Det. By:  LMP  ([DATE])          EDD:   [DATE]
Impression

 Vertex
Recommendations

 Continue with antenatal testing as clinically indicated

## 2021-12-06 NOTE — Progress Notes (Signed)
Pt informed that the ultrasound is considered a limited OB ultrasound and is not intended to be a complete ultrasound exam.  Patient also informed that the ultrasound is not being completed with the intent of assessing for fetal or placental anomalies or any pelvic abnormalities.  Explained that the purpose of todays ultrasound is to assess for presentation, BPP and amniotic fluid volume.  Patient acknowledges the purpose of the exam and the limitations of the study.    Pt reports decreased FM overall since d/c from hospital on 12/28 however baby is still moving daily.

## 2021-12-06 NOTE — Progress Notes (Signed)
Agree with nurses's documentation of this patient's clinic encounter.  [name] L, MD  

## 2021-12-07 ENCOUNTER — Encounter (HOSPITAL_COMMUNITY): Payer: Self-pay | Admitting: *Deleted

## 2021-12-07 ENCOUNTER — Telehealth (HOSPITAL_COMMUNITY): Payer: Self-pay | Admitting: *Deleted

## 2021-12-07 NOTE — Telephone Encounter (Signed)
Preadmission screen  

## 2021-12-09 ENCOUNTER — Other Ambulatory Visit: Payer: Self-pay | Admitting: Family Medicine

## 2021-12-11 ENCOUNTER — Other Ambulatory Visit: Payer: Self-pay

## 2021-12-11 ENCOUNTER — Encounter: Payer: Self-pay | Admitting: Obstetrics and Gynecology

## 2021-12-11 ENCOUNTER — Ambulatory Visit (INDEPENDENT_AMBULATORY_CARE_PROVIDER_SITE_OTHER): Payer: Self-pay | Admitting: Obstetrics and Gynecology

## 2021-12-11 ENCOUNTER — Other Ambulatory Visit (HOSPITAL_COMMUNITY)
Admission: RE | Admit: 2021-12-11 | Discharge: 2021-12-11 | Disposition: A | Discharge status: Home / Self Care | Payer: Self-pay | Source: Ambulatory Visit | Attending: Obstetrics and Gynecology | Admitting: Obstetrics and Gynecology

## 2021-12-11 VITALS — BP 131/91 | HR 74 | Wt 202.0 lb

## 2021-12-11 DIAGNOSIS — O119 Pre-existing hypertension with pre-eclampsia, unspecified trimester: Secondary | ICD-10-CM

## 2021-12-11 DIAGNOSIS — O1203 Gestational edema, third trimester: Secondary | ICD-10-CM

## 2021-12-11 DIAGNOSIS — Z3A36 36 weeks gestation of pregnancy: Secondary | ICD-10-CM | POA: Insufficient documentation

## 2021-12-11 DIAGNOSIS — Z3480 Encounter for supervision of other normal pregnancy, unspecified trimester: Secondary | ICD-10-CM | POA: Insufficient documentation

## 2021-12-11 DIAGNOSIS — F320 Major depressive disorder, single episode, mild: Secondary | ICD-10-CM

## 2021-12-11 LAB — POCT URINALYSIS DIPSTICK OB
Leukocytes, UA: NEGATIVE
Nitrite, UA: NEGATIVE
Spec Grav, UA: 1.015 (ref 1.010–1.025)
Urobilinogen, UA: 0.2 E.U./dL
pH, UA: 6.5 (ref 5.0–8.0)

## 2021-12-11 LAB — OB RESULTS CONSOLE GC/CHLAMYDIA: Gonorrhea: NEGATIVE

## 2021-12-11 NOTE — Progress Notes (Signed)
° °  PRENATAL VISIT NOTE  Subjective:  Madison Hood is a 31 y.o. [id number] at [redacted]w[redacted]d being seen today for ongoing prenatal care.  She is currently monitored for the following issues for this high-risk pregnancy and has Major depressive disorder, single episode, mild ([place]); Anxiety; Abnormal Pap smear of cervix  09/06/20 neg HPV +; Supervision of other normal pregnancy, antepartum; Constipation during pregnancy in second trimester; COVID-19 affecting pregnancy in second trimester; Mild pre-eclampsia; and Chronic hypertension with superimposed preeclampsia on their problem list.  Patient reports  intermittent changes in her vision but nothing that persists. She denies any HA/RUQ pain.  .  Contractions: Not present. Vag. Bleeding: None.  Movement: (!) Decreased. Denies leaking of fluid.   The following portions of the patient's history were reviewed and updated as appropriate: allergies, current medications, past family history, past medical history, past social history, past surgical history and problem list.   Objective:   Vitals:   12/11/21 1431  BP: (!) 131/91  Pulse: 74  Weight: 202 lb (91.6 kg)    Fetal Status:     Movement: (!) Decreased     General:  Alert, oriented and cooperative. Patient is in no acute distress.  Skin: Skin is warm and dry. No rash noted.   Cardiovascular: Normal heart rate noted  Respiratory: Normal respiratory effort, no problems with respiration noted  Abdomen: Soft, gravid, appropriate for gestational age.  Pain/Pressure: Present     Pelvic: Cervical exam performed in the presence of a chaperone        Extremities: Normal range of motion.  Edema: moderate pitting, slight indentation  Mental Status: Normal mood and affect. Normal behavior. Normal judgment and thought content.   Assessment and Plan:  Pregnancy: G4P1021 at [redacted]w[redacted]d 1. Chronic hypertension with superimposed preeclampsia Met criteria with new onset proteinuria 12/27. Labs stable on 1/3.  Recheck today.  No severe symptoms - blood pressure remains well controlled. She notes intermittent symptoms but nothing that is persistent.  For IOL on 1/13 Reviewed if persistent HA/BV/RUQ pain, to go to the hospital ([facility])  2. Major depressive disorder, single episode, mild (HCC) Mood stable  3. Supervision of other normal pregnancy, antepartum GBS/GC/CT done today  NST done today for decreased FM -- NST reactive, no decels, normal baseline  Preterm labor symptoms and general obstetric precautions including but not limited to vaginal bleeding, contractions, leaking of fluid and fetal movement were reviewed in detail with the patient. Please refer to After Visit Summary for other counseling recommendations.   No follow-ups on file.  Future Appointments  Date Time Provider [place]  12/14/2021  6:30 AM [facility] SCHED ROOM [facility] None    Radene Gunning, MD

## 2021-12-12 ENCOUNTER — Other Ambulatory Visit: Payer: Self-pay | Admitting: Obstetrics & Gynecology

## 2021-12-12 LAB — COMPREHENSIVE METABOLIC PANEL
ALT: 13 IU/L (ref 0–32)
AST: 26 IU/L (ref 0–40)
Albumin/Globulin Ratio: 1.6 (ref 1.2–2.2)
Albumin: 3 g/dL — ABNORMAL LOW (ref 3.8–4.8)
Alkaline Phosphatase: 127 IU/L — ABNORMAL HIGH (ref 44–121)
BUN/Creatinine Ratio: 20 (ref 9–23)
BUN: 12 mg/dL (ref 6–20)
Bilirubin Total: 0.3 mg/dL (ref 0.0–1.2)
CO2: 19 mmol/L — ABNORMAL LOW (ref 20–29)
Calcium: 8.7 mg/dL (ref 8.7–10.2)
Chloride: 105 mmol/L (ref 96–106)
Creatinine, Ser: 0.61 mg/dL (ref 0.57–1.00)
Globulin, Total: 1.9 g/dL (ref 1.5–4.5)
Glucose: 70 mg/dL (ref 70–99)
Potassium: 4.6 mmol/L (ref 3.5–5.2)
Sodium: 138 mmol/L (ref 134–144)
Total Protein: 4.9 g/dL — ABNORMAL LOW (ref 6.0–8.5)
eGFR: 122 mL/min/{1.73_m2} (ref >59)

## 2021-12-12 LAB — CERVICOVAGINAL ANCILLARY ONLY
Chlamydia: NEGATIVE
Comment: NEGATIVE
Comment: NEGATIVE
Comment: NORMAL
Neisseria Gonorrhea: NEGATIVE
Trichomonas: NEGATIVE

## 2021-12-12 LAB — CBC
Hematocrit: 30.3 % — ABNORMAL LOW (ref 34.0–46.6)
Hemoglobin: 10.1 g/dL — ABNORMAL LOW (ref 11.1–15.9)
MCH: 29.6 pg (ref 26.6–33.0)
MCHC: 33.3 g/dL (ref 31.5–35.7)
MCV: 89 fL (ref 79–97)
Platelets: 253 10*3/uL (ref 150–450)
RBC: 3.41 x10E6/uL — ABNORMAL LOW (ref 3.77–5.28)
RDW: 13.2 % (ref 11.7–15.4)
WBC: 7.4 10*3/uL (ref 3.4–10.8)

## 2021-12-12 LAB — SARS CORONAVIRUS 2 (TAT 6-24 HRS): SARS Coronavirus 2: NEGATIVE

## 2021-12-13 ENCOUNTER — Encounter (HOSPITAL_COMMUNITY): Payer: Self-pay | Admitting: Obstetrics & Gynecology

## 2021-12-14 ENCOUNTER — Inpatient Hospital Stay (HOSPITAL_COMMUNITY): Payer: Medicaid Other | Admitting: Anesthesiology

## 2021-12-14 ENCOUNTER — Inpatient Hospital Stay (HOSPITAL_COMMUNITY): Payer: Medicaid Other

## 2021-12-14 ENCOUNTER — Encounter (HOSPITAL_COMMUNITY)
Admission: AD | Disposition: A | Discharge status: Home / Self Care | Payer: Self-pay | Source: Home / Self Care | Attending: Obstetrics & Gynecology

## 2021-12-14 ENCOUNTER — Other Ambulatory Visit: Payer: Self-pay

## 2021-12-14 ENCOUNTER — Ambulatory Visit: Payer: Self-pay

## 2021-12-14 ENCOUNTER — Inpatient Hospital Stay (HOSPITAL_COMMUNITY): Payer: Medicaid Other | Admitting: Certified Registered Nurse Anesthetist

## 2021-12-14 ENCOUNTER — Encounter (HOSPITAL_COMMUNITY): Payer: Self-pay | Admitting: Family Medicine

## 2021-12-14 ENCOUNTER — Inpatient Hospital Stay (HOSPITAL_COMMUNITY)
Admission: AD | Admit: 2021-12-14 | Discharge: 2021-12-16 | DRG: 768 | Disposition: A | Discharge status: Home / Self Care | Payer: Medicaid Other | Attending: Obstetrics & Gynecology | Admitting: Obstetrics & Gynecology

## 2021-12-14 DIAGNOSIS — O1493 Unspecified pre-eclampsia, third trimester: Secondary | ICD-10-CM

## 2021-12-14 DIAGNOSIS — O114 Pre-existing hypertension with pre-eclampsia, complicating childbirth: Secondary | ICD-10-CM | POA: Diagnosis present

## 2021-12-14 DIAGNOSIS — Z8616 Personal history of COVID-19: Secondary | ICD-10-CM | POA: Diagnosis not present

## 2021-12-14 DIAGNOSIS — O1414 Severe pre-eclampsia complicating childbirth: Secondary | ICD-10-CM | POA: Diagnosis not present

## 2021-12-14 DIAGNOSIS — Z3A37 37 weeks gestation of pregnancy: Secondary | ICD-10-CM

## 2021-12-14 DIAGNOSIS — O1002 Pre-existing essential hypertension complicating childbirth: Secondary | ICD-10-CM | POA: Diagnosis present

## 2021-12-14 DIAGNOSIS — O119 Pre-existing hypertension with pre-eclampsia, unspecified trimester: Secondary | ICD-10-CM

## 2021-12-14 HISTORY — DX: Depression, unspecified: F32.A

## 2021-12-14 HISTORY — PX: DILATION AND EVACUATION: SHX1459

## 2021-12-14 HISTORY — DX: Essential (primary) hypertension: I10

## 2021-12-14 LAB — CBC
HCT: 30 % — ABNORMAL LOW (ref 36.0–46.0)
HCT: 32.1 % — ABNORMAL LOW (ref 36.0–46.0)
HCT: 31 % — ABNORMAL LOW (ref 36.0–46.0)
Hemoglobin: 9.4 g/dL — ABNORMAL LOW (ref 12.0–15.0)
Hemoglobin: 10.4 g/dL — ABNORMAL LOW (ref 12.0–15.0)
Hemoglobin: 10.7 g/dL — ABNORMAL LOW (ref 12.0–15.0)
MCH: 29 pg (ref 26.0–34.0)
MCH: 29.8 pg (ref 26.0–34.0)
MCH: 29.1 pg (ref 26.0–34.0)
MCHC: 31.3 g/dL (ref 30.0–36.0)
MCHC: 32.4 g/dL (ref 30.0–36.0)
MCHC: 34.5 g/dL (ref 30.0–36.0)
MCV: 92.6 fL (ref 80.0–100.0)
MCV: 92 fL (ref 80.0–100.0)
MCV: 84.2 fL (ref 80.0–100.0)
Platelets: 250 10*3/uL (ref 150–400)
Platelets: 266 10*3/uL (ref 150–400)
Platelets: 169 10*3/uL (ref 150–400)
RBC: 3.24 MIL/uL — ABNORMAL LOW (ref 3.87–5.11)
RBC: 3.49 MIL/uL — ABNORMAL LOW (ref 3.87–5.11)
RBC: 3.68 MIL/uL — ABNORMAL LOW (ref 3.87–5.11)
RDW: 13.7 % (ref 11.5–15.5)
RDW: 13.7 % (ref 11.5–15.5)
RDW: 16.6 % — ABNORMAL HIGH (ref 11.5–15.5)
WBC: 6.8 10*3/uL (ref 4.0–10.5)
WBC: 8.7 10*3/uL (ref 4.0–10.5)
WBC: 19.2 10*3/uL — ABNORMAL HIGH (ref 4.0–10.5)
nRBC: 0 % (ref 0.0–0.2)
nRBC: 0 % (ref 0.0–0.2)
nRBC: 0 % (ref 0.0–0.2)

## 2021-12-14 LAB — COMPREHENSIVE METABOLIC PANEL
ALT: 15 U/L (ref 0–44)
ALT: 15 U/L (ref 0–44)
AST: 24 U/L (ref 15–41)
AST: 31 U/L (ref 15–41)
Albumin: 2.1 g/dL — ABNORMAL LOW (ref 3.5–5.0)
Albumin: 2 g/dL — ABNORMAL LOW (ref 3.5–5.0)
Alkaline Phosphatase: 104 U/L (ref 38–126)
Alkaline Phosphatase: 76 U/L (ref 38–126)
Anion gap: 8 (ref 5–15)
Anion gap: 6 (ref 5–15)
BUN: 11 mg/dL (ref 6–20)
BUN: 11 mg/dL (ref 6–20)
CO2: 19 mmol/L — ABNORMAL LOW (ref 22–32)
CO2: 19 mmol/L — ABNORMAL LOW (ref 22–32)
Calcium: 8.2 mg/dL — ABNORMAL LOW (ref 8.9–10.3)
Calcium: 7.4 mg/dL — ABNORMAL LOW (ref 8.9–10.3)
Chloride: 109 mmol/L (ref 98–111)
Chloride: 105 mmol/L (ref 98–111)
Creatinine, Ser: 0.76 mg/dL (ref 0.44–1.00)
Creatinine, Ser: 0.7 mg/dL (ref 0.44–1.00)
GFR, Estimated: >60 mL/min (ref >60)
GFR, Estimated: >60 mL/min (ref >60)
Glucose, Bld: 71 mg/dL (ref 70–99)
Glucose, Bld: 87 mg/dL (ref 70–99)
Potassium: 3.7 mmol/L (ref 3.5–5.1)
Potassium: 3.8 mmol/L (ref 3.5–5.1)
Sodium: 136 mmol/L (ref 135–145)
Sodium: 130 mmol/L — ABNORMAL LOW (ref 135–145)
Total Bilirubin: 0.5 mg/dL (ref 0.3–1.2)
Total Bilirubin: 0.8 mg/dL (ref 0.3–1.2)
Total Protein: 5 g/dL — ABNORMAL LOW (ref 6.5–8.1)
Total Protein: 4.2 g/dL — ABNORMAL LOW (ref 6.5–8.1)

## 2021-12-14 LAB — DIC (DISSEMINATED INTRAVASCULAR COAGULATION)PANEL
D-Dimer, Quant: 2.05 ug/mL-FEU — ABNORMAL HIGH (ref 0.00–0.50)
Fibrinogen: 454 mg/dL (ref 210–475)
INR: 0.9 (ref 0.8–1.2)
Platelets: 260 10*3/uL (ref 150–400)
Prothrombin Time: 12.4 seconds (ref 11.4–15.2)
Smear Review: NONE SEEN
aPTT: 30 seconds (ref 24–36)

## 2021-12-14 LAB — PROTEIN / CREATININE RATIO, URINE
Creatinine, Urine: 307.53 mg/dL
Protein Creatinine Ratio: 5.7 mg/mg{Cre} — ABNORMAL HIGH (ref 0.00–0.15)
Total Protein, Urine: 1754 mg/dL

## 2021-12-14 LAB — MASSIVE TRANSFUSION PROTOCOL ORDER (BLOOD BANK NOTIFICATION)

## 2021-12-14 LAB — POSTPARTUM HEMORRHAGE PROTOCOL (BB NOTIFICATION)

## 2021-12-14 LAB — RPR: RPR Ser Ql: NONREACTIVE

## 2021-12-14 LAB — PROTIME-INR
INR: 1 (ref 0.8–1.2)
Prothrombin Time: 13.2 seconds (ref 11.4–15.2)

## 2021-12-14 LAB — APTT: aPTT: 28 seconds (ref 24–36)

## 2021-12-14 LAB — FIBRINOGEN: Fibrinogen: 356 mg/dL (ref 210–475)

## 2021-12-14 SURGERY — DILATION AND EVACUATION, UTERUS
Anesthesia: Epidural

## 2021-12-14 MED ORDER — MAGNESIUM SULFATE 40 GM/1000ML IV SOLN
2.0000 g/h | INTRAVENOUS | Status: AC
  Administered 2021-12-14: 21:00:00 2 g/h via INTRAVENOUS
  Filled 2021-12-14: qty 1000

## 2021-12-14 MED ORDER — EPHEDRINE 5 MG/ML INJ: 10.0000 mg | INTRAVENOUS | Status: DC | PRN

## 2021-12-14 MED ORDER — OXYCODONE-ACETAMINOPHEN 5-325 MG PO TABS: 1.0000 | ORAL_TABLET | ORAL | Status: DC | PRN

## 2021-12-14 MED ORDER — MISOPROSTOL 50MCG HALF TABLET
50.0000 ug | ORAL_TABLET | ORAL | Status: DC | PRN
  Administered 2021-12-14: 50 ug via BUCCAL
  Filled 2021-12-14: qty 1

## 2021-12-14 MED ORDER — TRANEXAMIC ACID-NACL 1000-0.7 MG/100ML-% IV SOLN
INTRAVENOUS | Status: DC | PRN
  Administered 2021-12-14: 1000 mg via INTRAVENOUS

## 2021-12-14 MED ORDER — FENTANYL CITRATE (PF) 100 MCG/2ML IJ SOLN: 25.0000 ug | INTRAMUSCULAR | Status: DC | PRN

## 2021-12-14 MED ORDER — CEFAZOLIN SODIUM-DEXTROSE 2-4 GM/100ML-% IV SOLN
INTRAVENOUS | Status: AC
  Filled 2021-12-14: qty 100

## 2021-12-14 MED ORDER — BENZOCAINE-MENTHOL 20-0.5 % EX AERO
1.0000 | INHALATION_SPRAY | CUTANEOUS | Status: DC | PRN
Start: 2021-12-14 — End: 2021-12-16

## 2021-12-14 MED ORDER — HYDRALAZINE HCL 20 MG/ML IJ SOLN
INTRAMUSCULAR | Status: AC
  Filled 2021-12-14: qty 1

## 2021-12-14 MED ORDER — TETANUS-DIPHTH-ACELL PERTUSSIS 5-2.5-18.5 LF-MCG/0.5 IM SUSY: 0.5000 mL | PREFILLED_SYRINGE | Freq: Once | INTRAMUSCULAR | Status: DC

## 2021-12-14 MED ORDER — ACETAMINOPHEN 325 MG PO TABS: 650.0000 mg | ORAL_TABLET | ORAL | Status: DC | PRN

## 2021-12-14 MED ORDER — DIPHENHYDRAMINE HCL 50 MG/ML IJ SOLN: 12.5000 mg | INTRAMUSCULAR | Status: DC | PRN

## 2021-12-14 MED ORDER — OXYCODONE HCL 5 MG PO TABS: 5.0000 mg | ORAL_TABLET | ORAL | Status: DC | PRN

## 2021-12-14 MED ORDER — ONDANSETRON HCL 4 MG/2ML IJ SOLN: 4.0000 mg | Freq: Four times a day (QID) | INTRAMUSCULAR | Status: DC | PRN

## 2021-12-14 MED ORDER — TRANEXAMIC ACID-NACL 1000-0.7 MG/100ML-% IV SOLN
INTRAVENOUS | Status: AC
  Filled 2021-12-14: qty 100

## 2021-12-14 MED ORDER — SOD CITRATE-CITRIC ACID 500-334 MG/5ML PO SOLN: 30.0000 mL | ORAL | Status: DC | PRN

## 2021-12-14 MED ORDER — CARBOPROST TROMETHAMINE 250 MCG/ML IM SOLN
INTRAMUSCULAR | Status: DC | PRN
  Administered 2021-12-14: 250 ug via INTRAMUSCULAR

## 2021-12-14 MED ORDER — MISOPROSTOL 200 MCG PO TABS
ORAL_TABLET | ORAL | Status: AC
  Filled 2021-12-14: qty 4

## 2021-12-14 MED ORDER — LACTATED RINGERS IV SOLN: INTRAVENOUS | Status: DC

## 2021-12-14 MED ORDER — ONDANSETRON HCL 4 MG/2ML IJ SOLN
INTRAMUSCULAR | Status: DC | PRN
  Administered 2021-12-14: 4 mg via INTRAVENOUS

## 2021-12-14 MED ORDER — LABETALOL HCL 5 MG/ML IV SOLN: 40.0000 mg | INTRAVENOUS | Status: DC | PRN

## 2021-12-14 MED ORDER — COCONUT OIL OIL
1.0000 "application " | TOPICAL_OIL | Status: DC | PRN
  Administered 2021-12-15: 1 via TOPICAL

## 2021-12-14 MED ORDER — PROMETHAZINE HCL 25 MG/ML IJ SOLN: 6.2500 mg | INTRAMUSCULAR | Status: DC | PRN

## 2021-12-14 MED ORDER — PHENYLEPHRINE HCL-NACL 20-0.9 MG/250ML-% IV SOLN
INTRAVENOUS | Status: DC | PRN
  Administered 2021-12-14: 60 ug/min via INTRAVENOUS

## 2021-12-14 MED ORDER — PHENYLEPHRINE 40 MCG/ML (10ML) SYRINGE FOR IV PUSH (FOR BLOOD PRESSURE SUPPORT): 80.0000 ug | PREFILLED_SYRINGE | INTRAVENOUS | Status: DC | PRN

## 2021-12-14 MED ORDER — IBUPROFEN 600 MG PO TABS
600.0000 mg | ORAL_TABLET | Freq: Four times a day (QID) | ORAL | Status: DC
  Administered 2021-12-15 – 2021-12-16 (×6): 600 mg via ORAL
  Filled 2021-12-14 (×7): qty 1

## 2021-12-14 MED ORDER — FLEET ENEMA 7-19 GM/118ML RE ENEM: 1.0000 | ENEMA | RECTAL | Status: DC | PRN

## 2021-12-14 MED ORDER — DOCUSATE SODIUM 100 MG PO CAPS: 100.0000 mg | ORAL_CAPSULE | Freq: Two times a day (BID) | ORAL | Status: DC | PRN

## 2021-12-14 MED ORDER — SODIUM CHLORIDE 0.9 % IV SOLN: INTRAVENOUS | Status: DC | PRN

## 2021-12-14 MED ORDER — PRENATAL MULTIVITAMIN CH
1.0000 | ORAL_TABLET | Freq: Every day | ORAL | Status: DC
  Administered 2021-12-15 – 2021-12-16 (×2): 1 via ORAL
  Filled 2021-12-14 (×2): qty 1

## 2021-12-14 MED ORDER — LABETALOL HCL 5 MG/ML IV SOLN
INTRAVENOUS | Status: AC
  Filled 2021-12-14: qty 4

## 2021-12-14 MED ORDER — MAGNESIUM SULFATE 40 GM/1000ML IV SOLN
2.0000 g/h | INTRAVENOUS | Status: DC
  Administered 2021-12-14: 2 g/h via INTRAVENOUS
  Filled 2021-12-14: qty 1000

## 2021-12-14 MED ORDER — TERBUTALINE SULFATE 1 MG/ML IJ SOLN: 0.2500 mg | Freq: Once | INTRAMUSCULAR | Status: DC | PRN

## 2021-12-14 MED ORDER — DEXAMETHASONE SODIUM PHOSPHATE 10 MG/ML IJ SOLN
INTRAMUSCULAR | Status: DC | PRN
Start: 2021-12-14 — End: 2021-12-14
  Administered 2021-12-14: 10 mg via INTRAVENOUS

## 2021-12-14 MED ORDER — ONDANSETRON HCL 4 MG/2ML IJ SOLN: 4.0000 mg | INTRAMUSCULAR | Status: DC | PRN

## 2021-12-14 MED ORDER — PHENYLEPHRINE HCL (PRESSORS) 10 MG/ML IV SOLN
INTRAVENOUS | Status: DC | PRN
  Administered 2021-12-14 (×2): 200 ug via INTRAVENOUS

## 2021-12-14 MED ORDER — FENTANYL CITRATE (PF) 100 MCG/2ML IJ SOLN
100.0000 ug | INTRAMUSCULAR | Status: DC | PRN
  Administered 2021-12-14 (×2): 100 ug via INTRAVENOUS
  Filled 2021-12-14 (×2): qty 2

## 2021-12-14 MED ORDER — OXYTOCIN-SODIUM CHLORIDE 30-0.9 UT/500ML-% IV SOLN
2.5000 [IU]/h | INTRAVENOUS | Status: DC
  Administered 2021-12-14: 2.5 [IU]/h via INTRAVENOUS
  Filled 2021-12-14: qty 500

## 2021-12-14 MED ORDER — LIDOCAINE-EPINEPHRINE (PF) 2 %-1:200000 IJ SOLN
INTRAMUSCULAR | Status: AC
  Filled 2021-12-14: qty 20

## 2021-12-14 MED ORDER — LIDOCAINE HCL (PF) 1 % IJ SOLN: 30.0000 mL | INTRAMUSCULAR | Status: DC | PRN

## 2021-12-14 MED ORDER — OXYTOCIN-SODIUM CHLORIDE 30-0.9 UT/500ML-% IV SOLN
INTRAVENOUS | Status: DC | PRN
  Administered 2021-12-14: 100 mL via INTRAVENOUS

## 2021-12-14 MED ORDER — DIBUCAINE (PERIANAL) 1 % EX OINT: 1.0000 "application " | TOPICAL_OINTMENT | CUTANEOUS | Status: DC | PRN

## 2021-12-14 MED ORDER — WITCH HAZEL-GLYCERIN EX PADS: 1.0000 "application " | MEDICATED_PAD | CUTANEOUS | Status: DC | PRN

## 2021-12-14 MED ORDER — LACTATED RINGERS IV SOLN
500.0000 mL | Freq: Once | INTRAVENOUS | Status: AC
  Administered 2021-12-14: 500 mL via INTRAVENOUS

## 2021-12-14 MED ORDER — EPHEDRINE 5 MG/ML INJ
10.0000 mg | INTRAVENOUS | Status: AC | PRN
  Administered 2021-12-14 (×2): 10 mg via INTRAVENOUS

## 2021-12-14 MED ORDER — DIPHENHYDRAMINE HCL 25 MG PO CAPS
25.0000 mg | ORAL_CAPSULE | Freq: Four times a day (QID) | ORAL | Status: DC | PRN
Start: 2021-12-14 — End: 2021-12-16

## 2021-12-14 MED ORDER — ACETAMINOPHEN 325 MG PO TABS
650.0000 mg | ORAL_TABLET | ORAL | Status: DC | PRN
  Administered 2021-12-15 – 2021-12-16 (×3): 650 mg via ORAL
  Filled 2021-12-14 (×3): qty 2

## 2021-12-14 MED ORDER — TRANEXAMIC ACID-NACL 1000-0.7 MG/100ML-% IV SOLN: 1000.0000 mg | Freq: Once | INTRAVENOUS | Status: AC

## 2021-12-14 MED ORDER — DEXAMETHASONE SODIUM PHOSPHATE 10 MG/ML IJ SOLN
INTRAMUSCULAR | Status: AC
  Filled 2021-12-14: qty 1

## 2021-12-14 MED ORDER — MISOPROSTOL 200 MCG PO TABS
800.0000 ug | ORAL_TABLET | Freq: Once | ORAL | Status: AC
  Administered 2021-12-14: 800 ug via RECTAL

## 2021-12-14 MED ORDER — PHENYLEPHRINE 40 MCG/ML (10ML) SYRINGE FOR IV PUSH (FOR BLOOD PRESSURE SUPPORT)
80.0000 ug | PREFILLED_SYRINGE | INTRAVENOUS | Status: DC | PRN
  Filled 2021-12-14: qty 10

## 2021-12-14 MED ORDER — HYDRALAZINE HCL 20 MG/ML IJ SOLN
5.0000 mg | INTRAMUSCULAR | Status: DC | PRN
  Administered 2021-12-14: 5 mg via INTRAVENOUS

## 2021-12-14 MED ORDER — SIMETHICONE 80 MG PO CHEW: 80.0000 mg | CHEWABLE_TABLET | ORAL | Status: DC | PRN

## 2021-12-14 MED ORDER — FENTANYL-BUPIVACAINE-NACL 0.5-0.125-0.9 MG/250ML-% EP SOLN
12.0000 mL/h | EPIDURAL | Status: DC | PRN
  Administered 2021-12-14: 12 mL/h via EPIDURAL
  Filled 2021-12-14: qty 250

## 2021-12-14 MED ORDER — LABETALOL HCL 5 MG/ML IV SOLN: 20.0000 mg | INTRAVENOUS | Status: DC | PRN

## 2021-12-14 MED ORDER — LIDOCAINE HCL (PF) 1 % IJ SOLN
INTRAMUSCULAR | Status: DC | PRN
  Administered 2021-12-14: 3 mL via EPIDURAL
  Administered 2021-12-14: 2 mL via EPIDURAL
  Administered 2021-12-14: 5 mL via EPIDURAL

## 2021-12-14 MED ORDER — LACTATED RINGERS IV SOLN: 500.0000 mL | INTRAVENOUS | Status: DC | PRN

## 2021-12-14 MED ORDER — HYDRALAZINE HCL 20 MG/ML IJ SOLN
10.0000 mg | INTRAMUSCULAR | Status: DC | PRN
  Administered 2021-12-14: 10 mg via INTRAVENOUS

## 2021-12-14 MED ORDER — DIPHENOXYLATE-ATROPINE 2.5-0.025 MG PO TABS
ORAL_TABLET | ORAL | Status: AC
  Filled 2021-12-14: qty 2

## 2021-12-14 MED ORDER — OXYTOCIN BOLUS FROM INFUSION
333.0000 mL | Freq: Once | INTRAVENOUS | Status: AC
  Administered 2021-12-14: 333 mL via INTRAVENOUS

## 2021-12-14 MED ORDER — LACTATED RINGERS IV BOLUS: 1000.0000 mL | Freq: Once | INTRAVENOUS | Status: DC

## 2021-12-14 MED ORDER — OXYCODONE-ACETAMINOPHEN 5-325 MG PO TABS: 2.0000 | ORAL_TABLET | ORAL | Status: DC | PRN

## 2021-12-14 MED ORDER — MISOPROSTOL 25 MCG QUARTER TABLET: 25.0000 ug | ORAL_TABLET | ORAL | Status: DC | PRN

## 2021-12-14 MED ORDER — TRANEXAMIC ACID-NACL 1000-0.7 MG/100ML-% IV SOLN
INTRAVENOUS | Status: AC
  Administered 2021-12-14: 1000 mg via INTRAVENOUS
  Filled 2021-12-14: qty 100

## 2021-12-14 MED ORDER — OXYCODONE HCL 5 MG PO TABS: 10.0000 mg | ORAL_TABLET | ORAL | Status: DC | PRN

## 2021-12-14 MED ORDER — ATROPINE SULFATE 0.4 MG/ML IV SOLN
INTRAVENOUS | Status: AC
  Filled 2021-12-14: qty 1

## 2021-12-14 MED ORDER — LIDOCAINE-EPINEPHRINE (PF) 2 %-1:200000 IJ SOLN
INTRAMUSCULAR | Status: DC | PRN
  Administered 2021-12-14: 5 mL via EPIDURAL

## 2021-12-14 MED ORDER — CEFAZOLIN SODIUM-DEXTROSE 2-4 GM/100ML-% IV SOLN
2.0000 g | Freq: Once | INTRAVENOUS | Status: AC
  Administered 2021-12-14: 2 g via INTRAVENOUS

## 2021-12-14 MED ORDER — ONDANSETRON HCL 4 MG/2ML IJ SOLN
INTRAMUSCULAR | Status: AC
  Filled 2021-12-14: qty 2

## 2021-12-14 MED ORDER — MAGNESIUM SULFATE BOLUS VIA INFUSION
4.0000 g | Freq: Once | INTRAVENOUS | Status: AC
  Administered 2021-12-14: 4 g via INTRAVENOUS
  Filled 2021-12-14: qty 1000

## 2021-12-14 MED ORDER — LABETALOL HCL 200 MG PO TABS: 200.0000 mg | ORAL_TABLET | Freq: Two times a day (BID) | ORAL | Status: DC

## 2021-12-14 MED ORDER — LACTATED RINGERS IV SOLN: 500.0000 mL | Freq: Once | INTRAVENOUS | Status: DC

## 2021-12-14 MED ORDER — ONDANSETRON HCL 4 MG PO TABS: 4.0000 mg | ORAL_TABLET | ORAL | Status: DC | PRN

## 2021-12-14 MED ORDER — DIPHENOXYLATE-ATROPINE 2.5-0.025 MG PO TABS
2.0000 | ORAL_TABLET | ORAL | Status: AC
  Administered 2021-12-14: 2 via ORAL

## 2021-12-14 SURGICAL SUPPLY — 18 items
CATH ROBINSON RED A/P 16FR (CATHETERS) ×3 IMPLANT
CLOTH BEACON ORANGE TIMEOUT ST (SAFETY) ×3 IMPLANT
DECANTER SPIKE VIAL GLASS SM (MISCELLANEOUS) ×3 IMPLANT
GLOVE BIOGEL PI IND STRL 7.0 (GLOVE) ×4 IMPLANT
GLOVE BIOGEL PI INDICATOR 7.0 (GLOVE) ×2
GLOVE ECLIPSE 7.0 STRL STRAW (GLOVE) ×6 IMPLANT
GOWN STRL REUS W/TWL LRG LVL3 (GOWN DISPOSABLE) ×9 IMPLANT
KIT BERKELEY 1ST TRIMESTER 3/8 (MISCELLANEOUS) ×3 IMPLANT
NS IRRIG 1000ML POUR BTL (IV SOLUTION) ×3 IMPLANT
PACK VAGINAL MINOR WOMEN LF (CUSTOM PROCEDURE TRAY) ×3 IMPLANT
PAD OB MATERNITY 4.3X12.25 (PERSONAL CARE ITEMS) ×3 IMPLANT
PAD PREP 24X48 CUFFED NSTRL (MISCELLANEOUS) ×3 IMPLANT
SET BERKELEY SUCTION TUBING (SUCTIONS) ×3 IMPLANT
TOWEL OR 17X24 6PK STRL BLUE (TOWEL DISPOSABLE) ×6 IMPLANT
VACURETTE 10 RIGID CVD (CANNULA) IMPLANT
VACURETTE 7MM CVD STRL WRAP (CANNULA) IMPLANT
VACURETTE 8 RIGID CVD (CANNULA) IMPLANT
VACURETTE 9 RIGID CVD (CANNULA) IMPLANT

## 2021-12-14 NOTE — Anesthesia Preprocedure Evaluation (Signed)
Anesthesia Evaluation  Patient identified by MRN, date of birth, ID band Patient confused    Reviewed: Allergy & Precautions, NPO status , Patient's Chart, lab work & pertinent test results, reviewed documented beta blocker date and time , Unable to perform ROS - Chart review onlyPreop documentation limited or incomplete due to emergent nature of procedure.  Airway Mallampati: II  TM Distance: >3 FB Neck ROM: Full    Dental  (+) Teeth Intact, Dental Advisory Given   Pulmonary neg pulmonary ROS,    Pulmonary exam normal breath sounds clear to auscultation       Cardiovascular hypertension (Pre-eclampsia), Pt. on home beta blockers Normal cardiovascular exam Rhythm:Regular Rate:Normal     Neuro/Psych PSYCHIATRIC DISORDERS Anxiety Depression negative neurological ROS     GI/Hepatic negative GI ROS, Neg liver ROS,   Endo/Other  negative endocrine ROS  Renal/GU negative Renal ROS     Musculoskeletal negative musculoskeletal ROS (+)   Abdominal   Peds  Hematology negative hematology ROS (+)   Anesthesia Other Findings Day of surgery medications reviewed with the patient.  Reproductive/Obstetrics post partum hemmorhage                             Anesthesia Physical Anesthesia Plan  ASA: 5 and emergent  Anesthesia Plan: Epidural   Post-op Pain Management:    Induction:   PONV Risk Score and Plan: 2 and Treatment may vary due to age or medical condition, Dexamethasone and Ondansetron  Airway Management Planned: Natural Airway and Simple Face Mask  Additional Equipment:   Intra-op Plan:   Post-operative Plan:   Informed Consent:     Only emergency history available and History available from chart only  Plan Discussed with:   Anesthesia Plan Comments:         Anesthesia Quick Evaluation

## 2021-12-14 NOTE — Progress Notes (Addendum)
OB Note Madison Hood with minimal to no output, foley with 320mL over 35m and last of blood products in at 1730. HR in the 80s and 100s/60s. NAD, RRR no MRGs, CTAB and no pulm distress, abdomen soft nttp nd with firm fundus and lower uterine segment with U-3 to -4 with no VB on massage. Madison Hood removed without issue. Plan to watch for 30 minutes and if still doing well, transfer to [facility]. Plan to recheck labs at [place] and leave foley in overnight and restart Mg for severe pre-eclampsia on transfer to [place] for [place] (Faculty Practice) 12/14/2021 Time: [phone]

## 2021-12-14 NOTE — Transfer of Care (Signed)
Immediate Anesthesia Transfer of Care Note  Patient: Madison Hood  Procedure(s) Performed: DILATATION AND EVACUATION  Patient Location: PACU  Anesthesia Type:Epidural  Level of Consciousness: awake, alert  and oriented  Airway & Oxygen Therapy: Patient Spontanous Breathing  Post-op Assessment: Report given to RN and Post -op Vital signs reviewed and stable  Post vital signs: Reviewed and stable HR 70, RR 20, SaO2 100%, BP 110/70  Last Vitals:  Vitals Value Taken Time  BP    Temp    Pulse    Resp    SpO2      Last Pain:  Vitals:   12/14/21 1510  TempSrc:   PainSc: 4          Complications: No notable events documented.

## 2021-12-14 NOTE — Lactation Note (Signed)
This note was copied from a baby's chart. Lactation Consultation Note  Patient Name: Madison Hood [id number] Date: 12/14/2021 Reason for consult: Initial assessment;Mother's request;Late-preterm 34-36.6wks;Infant < 6lbs;Breastfeeding assistance;Other (Comment) (GHTN labetalol, Mg Sulftate, PPH with blood txn.) Age:32 hours  LC on arrival met RN, Madison Hood working to give infant glucose gel following low level of 20. LC then gave 2 ml EBM on spoon and 3 ml of formula with finger feeding and a curve tip with assistance of charge nurse. RN, Madison Hood then transported infant to nursery for temp regulation and will continue to work on feeding with Nfant extra slow flow nipple.   LPTI guidelines reviewed infant less than 6 lbs. Mom aware to keep total feeding under 30 mins.   Madison Hood working with Mom to assess flange size and create a hands free bra for pumping. Mom states prefer to continue [place] visit later, needs some rest. Madison Hood visit discontinued without setting up hands free pump.  Plan 1. To feed based on cues 8-12x 24hr period. Mom to offer breasts and look for signs of milk transfer.  2. Mom to supplement with EBM first followed by formula with pace bottle feeding with Nfant extra slow flow 5-10 ml. If infant not latching at the breast, Mom aware offer more 3. DEBP q 3hrs for 15 min   All questions answered at the end of the visit.  RN will work with Mom to wash pump parts and help her pump when she is able.  Mom aware [place] services are available overnight to call for assistance if needed.  Maternal Data Has patient been taught Hand Expression?: Yes Does the patient have breastfeeding experience prior to this delivery?: Yes How long did the patient breastfeed?: 2 months  Feeding Mother's Current Feeding Choice: Breast Milk and Formula Nipple Type: Nfant Standard Flow (white)  LATCH Score                    Lactation Tools Discussed/Used Tools:  Pump;Flanges;Hands-free pumping top Flange Size: 24 Breast pump type: Double-Electric Breast Pump Pump Education: Setup, frequency, and cleaning;Milk Storage Reason for Pumping: increase stimulation Pumping frequency: every 3 hrs for 15 min  Interventions Interventions: Breast feeding basics reviewed;Hand express;Breast compression;Position options;Expressed milk;DEBP;Education;LC Services brochure;Infant Driven Feeding Algorithm education;Skin to skin  Discharge Pump: Manual WIC Program: Yes  Consult Status Consult Status: Follow-up Date: 12/15/21 Follow-up type: In-patient    Madison Hood  Madison Hood 12/14/2021, 8:59 PM

## 2021-12-14 NOTE — Discharge Summary (Signed)
Postpartum Discharge Summary  Date of Service updated 12/16/2021      Patient Name: Madison Hood DOB: [birth year] MRN: [id number]  Date of admission: 12/14/2021 Delivery date:12/14/2021  Delivering provider: Adda Backer  Date of discharge: 12/16/2021  Admitting diagnosis: Preeclampsia, third trimester [O14.93] Intrauterine pregnancy: [redacted]w[redacted]d    Secondary diagnosis:  Principal Problem:   Preeclampsia, third trimester Active Problems:   Retained placenta  Additional problems: None    Discharge diagnosis: Term Pregnancy Delivered, Gestational Hypertension, Preeclampsia (severe), and PPH                                              Post partum procedures:blood transfusion and D&E for retained placenta Augmentation: Cytotec Complications: [id number] Hospital course: Induction of Labor With Vaginal Delivery   31y.o. yo [id number] [id number] admitted to the hospital 12/14/2021 for induction of labor.  Indication for induction: Preeclampsia.  Patient had an uncomplicated labor course as follows: Membrane Rupture Time/Date: 2:20 PM ,12/14/2021   Delivery Method:Vaginal, Spontaneous  Episiotomy: None  Lacerations:  None  Details of delivery can be found in separate delivery note.   She had PP hemorrhage and required manual removal of the placenta and suction curettage in OR for possible retained products. She received 4 units PRBC and 2 units FFP and Jada was placed with good resolution of hemorrhage. She did well and was on magnesium sulfate 24 hr PP  Patient is discharged home 12/16/21.  Newborn Data: Birth date:12/14/2021  Birth time:3:47 PM  Gender:Female  Living status:Living  Apgars:9 ,9  Weight:2420 g   Magnesium Sulfate received: Yes: Seizure prophylaxis BMZ received: No Rhophylac:N/A MMR:N/A T-DaP:Given prenatally Flu: N/A Transfusion:Yes  Physical exam  Vitals:   12/15/21 1605 12/15/21 1947 12/15/21 2311 12/16/21 0630  BP:  139/70 119/65 133/76   Pulse:  76 67 68  Resp: _0 Temp:  98.3 F (36.8 C) 98.1 F (36.7 C) 98.2 F (36.8 C)  TempSrc:  Oral Oral Oral  SpO2:  98% 98% 100%  Weight:      Height:       General: alert, cooperative, and no distress Lochia: appropriate Uterine Fundus: firm Incision: N/A DVT Evaluation: No evidence of DVT seen on physical exam. Labs: Lab Results  Component Value Date   WBC 19.2 (H) 12/14/2021   HGB 10.7 (L) 12/14/2021   HCT 31.0 (L) 12/14/2021   MCV 84.2 12/14/2021   PLT 169 12/14/2021   CMP Latest Ref Rng & Units 12/15/2021  Glucose 70 - 99 mg/dL 106(H)  BUN 6 - 20 mg/dL 12  Creatinine 0.44 - 1.00 mg/dL 0.79  Sodium 135 - 145 mmol/L 132(L)  Potassium 3.5 - 5.1 mmol/L 3.7  Chloride 98 - 111 mmol/L 103  CO2 22 - 32 mmol/L 21(L)  Calcium 8.9 - 10.3 mg/dL 7.0(L)  Total Protein 6.5 - 8.1 g/dL 4.1(L)  Total Bilirubin 0.3 - 1.2 mg/dL 0.5  Alkaline Phos 38 - 126 U/L 70  AST 15 - 41 U/L 30  ALT 0 - 44 U/L 16   Edinburgh Score: No flowsheet data found.   After visit meds:  Allergies as of 12/16/2021   No Known Allergies      Medication List     STOP taking these medications    labetalol 200 MG tablet Commonly known as: NORMODYNE  TAKE these medications    ibuprofen 600 MG tablet Commonly known as: ADVIL Take 1 tablet (600 mg total) by mouth every 6 (six) hours.   NIFEdipine 30 MG 24 hr tablet Commonly known as: ADALAT CC Take 1 tablet (30 mg total) by mouth daily.   prenatal multivitamin Tabs tablet Take 1 tablet by mouth daily at 12 noon.         Discharge home in stable condition Infant Feeding: Bottle Infant Disposition:home with mother Discharge instruction: per After Visit Summary and Postpartum booklet. Activity: Advance as tolerated. Pelvic rest for 6 weeks.  Diet: routine diet Future Appointments:No future appointments. Follow up Visit:  [place] Follow up in 1 week(s).    Specialty: Obstetrics and Gynecology Why: BP check Contact information: [address], [place] [phone]                 Please schedule this patient for a In person postpartum visit in 6 weeks with the following provider: Any provider. Additional Postpartum F/U:BP check 1 week  High risk pregnancy complicated by: HTN Delivery mode:  Vaginal, Spontaneous  Anticipated Birth Control:  Unsure   12/16/2021 Emeterio Reeve, MD

## 2021-12-14 NOTE — Anesthesia Preprocedure Evaluation (Signed)
Anesthesia Evaluation  Patient identified by MRN, date of birth, ID band Patient awake    Reviewed: Allergy & Precautions, NPO status , Patient's Chart, lab work & pertinent test results  Airway Mallampati: II  TM Distance: >3 FB Neck ROM: Full    Dental  (+) Teeth Intact, Dental Advisory Given   Pulmonary neg pulmonary ROS,    Pulmonary exam normal breath sounds clear to auscultation       Cardiovascular hypertension (Pre-eclampsia), Normal cardiovascular exam Rhythm:Regular Rate:Normal     Neuro/Psych PSYCHIATRIC DISORDERS Anxiety Depression negative neurological ROS     GI/Hepatic negative GI ROS, Neg liver ROS,   Endo/Other  negative endocrine ROSObesity   Renal/GU negative Renal ROS     Musculoskeletal negative musculoskeletal ROS (+)   Abdominal   Peds  Hematology  (+) Blood dyscrasia, anemia , Plt 250k   Anesthesia Other Findings Day of surgery medications reviewed with the patient.  Reproductive/Obstetrics (+) Pregnancy                             Anesthesia Physical Anesthesia Plan  ASA: 3  Anesthesia Plan: Epidural   Post-op Pain Management:    Induction:   PONV Risk Score and Plan: 2 and Treatment may vary due to age or medical condition  Airway Management Planned: Natural Airway  Additional Equipment:   Intra-op Plan:   Post-operative Plan:   Informed Consent: I have reviewed the patients History and Physical, chart, labs and discussed the procedure including the risks, benefits and alternatives for the proposed anesthesia with the patient or authorized representative who has indicated his/her understanding and acceptance.     Dental advisory given  Plan Discussed with:   Anesthesia Plan Comments: (Patient identified. Risks/Benefits/Options discussed with patient including but not limited to bleeding, infection, nerve damage, paralysis, failed block,  incomplete pain control, headache, blood pressure changes, nausea, vomiting, reactions to medication both or allergic, itching and postpartum back pain. Confirmed with bedside nurse the patient's most recent platelet count. Confirmed with patient that they are not currently taking any anticoagulation, have any bleeding history or any family history of bleeding disorders. Patient expressed understanding and wished to proceed. All questions were answered. )        Anesthesia Quick Evaluation

## 2021-12-14 NOTE — Op Note (Signed)
Madison Hood PROCEDURE DATE: 12/14/2021  PREOPERATIVE DIAGNOSIS: 37 weeks postpartum hemorrhage  POSTOPERATIVE DIAGNOSIS: The same PROCEDURE:    Uterine evacuation, Jada placement SURGEON:  Scheryl Darter MD Assistant: Lyndel Safe MD An experienced assistant was required given the standard of surgical care given the complexity of the case.  This assistant was needed for exposure, dissection, suctioning, retraction, instrument exchange, and for overall help during the procedure.   INDICATIONS: 31 y.o. [id number] with postpartum hemorrhage [redacted]  weeks gestation, needing surgical evaluation for possible retained placenta.  Risks of surgery were discussed with the patient including but not limited to: bleeding which may require transfusion; infection which may require antibiotics; injury to uterus or surrounding organs; need for additional procedures including laparotomy or laparoscopy; possibility of intrauterine scarring which may impair future fertility; and other postoperative/anesthesia complications. Written informed consent was obtained.   FINDINGS:  A 18 week size uterus, minimal amounts of products of conception, specimen sent to pathology.  ANESTHESIA:   Epidural. INTRAVENOUS FLUIDS:  2700 ml of NaCl, 4 units PRBC, units FFP ESTIMATED BLOOD LOSS:  total 3800 for event SPECIMENS:  Products of conception sent to pathology COMPLICATIONS:  None immediate.  PROCEDURE DETAILS:    She was then taken to the operating room where epidural was found to be adequate.  After an adequate timeout was performed, she was placed in the dorsal lithotomy position and examined; then prepped and draped in the sterile manner.   The uterus was explored manually and heavy bleeding was noted A vaginal speculum was then placed in the patient's vagina and a ring forceps was applied to the anterior lip of the cervix.  The cervix  accommodated a 16 mm suction curette that was gently advanced to the uterine fundus.   The suction device was then activated and curette slowly rotated to clear the uterus of products of conception.  Complete evacuation was confirmed. Jada device was placed and 120 ml of saline was injected and placed on suction. Bleeding was improved and fundus was firm.  Sponge and instrument counts were correct times two  The patient tolerated the procedure well and was taken to the recovery area awake, and in stable condition.   Adam Phenix, MD 12/14/2021 5:31 PM

## 2021-12-14 NOTE — Anesthesia Procedure Notes (Signed)
Epidural Patient location during procedure: OB Start time: 12/14/2021 2:08 PM End time: 12/14/2021 2:14 PM  Staffing Anesthesiologist: Cecile Hearing, MD Performed: anesthesiologist   Preanesthetic Checklist Completed: patient identified, IV checked, risks and benefits discussed, monitors and equipment checked, pre-op evaluation and timeout performed  Epidural Patient position: sitting Prep: DuraPrep Patient monitoring: blood pressure and continuous pulse ox Approach: midline Location: L3-L4 Injection technique: LOR air  Needle:  Needle type: Tuohy  Needle gauge: 17 G Needle length: 9 cm Needle insertion depth: 6 cm Catheter size: 19 Gauge Catheter at skin depth: 11 cm Test dose: negative and Other (1% Lidocaine)  Additional Notes Patient identified.  Risk benefits discussed including failed block, incomplete pain control, headache, nerve damage, paralysis, blood pressure changes, nausea, vomiting, reactions to medication both toxic or allergic, and postpartum back pain.  Patient expressed understanding and wished to proceed.  All questions were answered.  Sterile technique used throughout procedure and epidural site dressed with sterile barrier dressing. No paresthesia or other complications noted. The patient did not experience any signs of intravascular injection such as tinnitus or metallic taste in mouth nor signs of intrathecal spread such as rapid motor block. Please see nursing notes for vital signs. Reason for block:procedure for pain

## 2021-12-14 NOTE — H&P (Signed)
OBSTETRIC ADMISSION HISTORY AND PHYSICAL  Madison BertholdFabiola Hood is a 31 y.o. female [phone] with IUP at 4968w0d by LMP c/w 8-wk US presenting for IOL for cHTN w/ SIPE w/o SF. She reports +FMs, No LOF, no VB, no blurry vision, headaches or peripheral edema, and RUQ pain.  FOB at the bedside. She plans on breast feeding. She requests Mirena IUD at postpartum visit for birth control. [facility]    Dating: By LMP --->  Estimated Date of Delivery: 01/04/22  Sono:    @[redacted]w[redacted]d , CWD, normal anatomy, cephalic presentation, anterior lie, 2140 g, 11% EFW   Prenatal History/Complications: Chronic HTN w/ SIPE w/o SF, COVID-19 in pregnancy, MDD/anxiety  Past Medical History: Past Medical History:  Diagnosis Date   Anemia    Depression    History of anemia    History of elective abortion    History of spontaneous abortion    Hypertension     Past Surgical History: Past Surgical History:  Procedure Laterality Date   denies surgical history      Obstetrical History: OB History     Gravida  4   Para  1   Term  1   Preterm  0   AB  2   Living  1      SAB  2   IAB  0   Ectopic  0   Multiple  0   Live Births  1           Social History Social History   Socioeconomic History   Marital status: Married    Spouse name: separated currently   Number of children: 1   Years of education: 12   Highest education level: High school graduate  Occupational History   Occupation: receptionist  Tobacco Use   Smoking status: Never   Smokeless tobacco: Never  Vaping Use   Vaping Use: Never used  Substance and Sexual Activity   Alcohol use: Not Currently    Alcohol/week: 1.0 - 5.0 standard drink    Types: 1 Glasses of wine per week    Comment:  Last use one glass of wine 2 days ago.   Drug use: Not Currently    Types: Marijuana    Comment: stopped in May 2022   Sexual activity: Yes    Partners: Male  Other Topics Concern   Not on file   Social History Narrative   [place]; in the [place] since [place].   Social Determinants of Health   Financial Resource Strain: Not on file  Food Insecurity: Not on file  Transportation Needs: Not on file  Physical Activity: Not on file  Stress: Not on file  Social Connections: Not on file    Family History: Family History  Problem Relation Age of Onset   Heart disease Mother    Hyperlipidemia Mother    Hypertension Mother    Allergies Mother    Hyperlipidemia Father    Migraines Sister    Migraines Sister    Cancer Maternal Grandmother     Allergies: No Known Allergies  Medications Prior to Admission  Medication Sig Dispense Refill Last Dose   labetalol (NORMODYNE) 200 MG tablet Take 1 tablet (200 mg total) by mouth 2 (two) times daily. 60 tablet 1 12/14/2021 at 0700   Prenatal Vit-Fe Fumarate-FA (PRENATAL MULTIVITAMIN) TABS tablet Take 1 tablet by mouth daily at 12 noon.   Past Week     Review of Systems  All systems reviewed and negative except as stated in HPI  Blood pressure (!) 156/99, pulse 78, temperature 97.9 F (36.6 C), temperature source Oral, resp. rate 18, height 5\' 7"  (1.702 m), weight 90.9 kg, last menstrual period 03/30/2021. General appearance: WDWN, alert, cooperative, appears stated age, and no distress laying in bed Lungs: normal work of breathing Heart: regular rate and rhythm Abdomen: soft, non-tender, gravid Pelvic: Deferred Extremities: Homans sign is negative, no sign of DVT DTR's normal Presentation:  Vertex Fetal monitoringBaseline: 135 bpm, Variability: Good {> 6 bpm), Accelerations: Reactive, and Decelerations: Absent Uterine activity: None Dilation: Closed Effacement (%): 30 Station: -2 Exam by:: [email or web address] RN   Prenatal labs: ABO, Rh: --/--/A POS (01/13 0840) Antibody: NEG (01/13 0840) Rubella: Immune (07/14 0000) RPR: NON REACTIVE (01/13 0841)  HBsAg: Negative (07/13 1648)  HIV: Non Reactive (11/17 1106)  GBS:     1 hr Glucola normal Genetic screening low risk Anatomy 08-22-1985 normal  Prenatal Transfer Tool  Maternal Diabetes: No Genetic Screening: Normal Maternal Ultrasounds/Referrals: Normal Fetal Ultrasounds or other Referrals:  Referred to Materal Fetal Medicine  Maternal Substance Abuse:  No Significant Maternal Medications:  Meds include: Other: Labetalol 200 mg BID Significant Maternal Lab Results: Other: GBS pending  Results for orders placed or performed during the hospital encounter of 12/14/21 (from the past 24 hour(s))  Protein / creatinine ratio, urine   Collection Time: 12/14/21  8:40 AM  Result Value Ref Range   Creatinine, Urine 307.53 mg/dL   Total Protein, Urine PENDING mg/dL   Protein Creatinine Ratio PENDING 0.00 - 0.15 mg/mg[Cre]  Type and screen   Collection Time: 12/14/21  8:40 AM  Result Value Ref Range   ABO/RH(D) A POS    Antibody Screen NEG    Sample Expiration      12/17/2021,2359 Performed at [facility], [address], [place], [place]   CBC   Collection Time: 12/14/21  8:41 AM  Result Value Ref Range   WBC 6.8 4.0 - 10.5 K/uL   RBC 3.24 (L) 3.87 - 5.11 MIL/uL   Hemoglobin 9.4 (L) 12.0 - 15.0 g/dL   HCT 12/16/21 (L) 76.2 - 83.1 %   MCV 92.6 80.0 - 100.0 fL   MCH 29.0 26.0 - 34.0 pg   MCHC 31.3 30.0 - 36.0 g/dL   RDW 51.7 61.6 - 07.3 %   Platelets 250 150 - 400 K/uL   nRBC 0.0 0.0 - 0.2 %  RPR   Collection Time: 12/14/21  8:41 AM  Result Value Ref Range   RPR Ser Ql NON REACTIVE NON REACTIVE  Comprehensive metabolic panel   Collection Time: 12/14/21  8:41 AM  Result Value Ref Range   Sodium 136 135 - 145 mmol/L   Potassium 3.7 3.5 - 5.1 mmol/L   Chloride 109 98 - 111 mmol/L   CO2 19 (L) 22 - 32 mmol/L   Glucose, Bld 71 70 - 99 mg/dL   BUN 11 6 - 20 mg/dL   Creatinine, Ser 12/16/21 0.44 - 1.00 mg/dL   Calcium 8.2 (L) 8.9 - 10.3 mg/dL   Total Protein 5.0 (L) 6.5 - 8.1 g/dL   Albumin 2.1 (L) 3.5 - 5.0 g/dL   AST 24 15 - 41 U/L   ALT 15  0 - 44 U/L   Alkaline Phosphatase 104 38 - 126 U/L   Total Bilirubin 0.5 0.3 - 1.2 mg/dL   GFR, Estimated 6.26 >94 mL/min   Anion gap 8 5 -  15    Patient Active Problem List   Diagnosis Date Noted   Preeclampsia, third trimester 12/14/2021   Chronic hypertension with superimposed preeclampsia 11/27/2021   Mild pre-eclampsia 11/19/2021   COVID-19 affecting pregnancy in second trimester 09/20/2021   Constipation during pregnancy in second trimester 08/22/2021   Supervision of other normal pregnancy, antepartum 06/13/2021   Abnormal Pap smear of cervix  09/06/20 neg HPV + 09/14/2020   Anxiety 09/06/2020   Major depressive disorder, single episode, mild (HCC) 01/20/2017    Assessment/Plan:  Diantha Paxson is a 31 y.o. [id number] at [redacted]w[redacted]d here for IOL for cHTN w/ SIPE w/o SF.  #Labor: Admit to L&D for anticipated NSVD. Start light labor diet, fetal monitoring. S/p cytotec x1. Cervical check 4 hrs post-cytotec to determine next steps for IOL, consider oral cytotec, FB, pitocin. Discussed with patient who is understanding and in agreement.  #Pain: As desired, reports wanting IV pain medication and epidural #FWB: Category I #ID:  Pending GBS status #MOF: Breast #MOC: Desires Mirena IUD at pp visit OP #Circ:  N/A  Raylene Everts, MD  12/14/2021, 11:26 AM

## 2021-12-15 ENCOUNTER — Encounter (HOSPITAL_COMMUNITY): Payer: Self-pay | Admitting: Family Medicine

## 2021-12-15 LAB — COMPREHENSIVE METABOLIC PANEL
ALT: 16 U/L (ref 0–44)
AST: 30 U/L (ref 15–41)
Albumin: 1.9 g/dL — ABNORMAL LOW (ref 3.5–5.0)
Alkaline Phosphatase: 70 U/L (ref 38–126)
Anion gap: 8 (ref 5–15)
BUN: 12 mg/dL (ref 6–20)
CO2: 21 mmol/L — ABNORMAL LOW (ref 22–32)
Calcium: 7 mg/dL — ABNORMAL LOW (ref 8.9–10.3)
Chloride: 103 mmol/L (ref 98–111)
Creatinine, Ser: 0.79 mg/dL (ref 0.44–1.00)
GFR, Estimated: >60 mL/min (ref >60)
Glucose, Bld: 106 mg/dL — ABNORMAL HIGH (ref 70–99)
Potassium: 3.7 mmol/L (ref 3.5–5.1)
Sodium: 132 mmol/L — ABNORMAL LOW (ref 135–145)
Total Bilirubin: 0.5 mg/dL (ref 0.3–1.2)
Total Protein: 4.1 g/dL — ABNORMAL LOW (ref 6.5–8.1)

## 2021-12-15 LAB — PREPARE FRESH FROZEN PLASMA: Unit division: 0

## 2021-12-15 LAB — BPAM FFP
Blood Product Expiration Date: 202301162359
Blood Product Expiration Date: 202301162359
ISSUE DATE / TIME: 202301131626
ISSUE DATE / TIME: 202301131626
Unit Type and Rh: 600
Unit Type and Rh: 6200

## 2021-12-15 LAB — CULTURE, BETA STREP (GROUP B ONLY): Strep Gp B Culture: NEGATIVE

## 2021-12-15 NOTE — Anesthesia Postprocedure Evaluation (Signed)
Anesthesia Post Note  Patient: Madison Hood  Procedure(s) Performed: AN AD HOC LABOR EPIDURAL     Patient location during evaluation: PACU Anesthesia Type: Epidural Level of consciousness: awake and alert Pain management: pain level controlled Vital Signs Assessment: post-procedure vital signs reviewed and stable Respiratory status: spontaneous breathing, nonlabored ventilation and respiratory function stable Cardiovascular status: stable Postop Assessment: no headache, no backache and epidural receding Anesthetic complications: no   No notable events documented.  Last Vitals:  Vitals:   12/15/21 0455 12/15/21 0600  BP: 125/82   Pulse: 66   Resp: 17 16  Temp: 36.6 C   SpO2: 99%     Last Pain:  Vitals:   12/15/21 0600  TempSrc:   PainSc: 3    Pain Goal:                   Cecile Hearing

## 2021-12-15 NOTE — Progress Notes (Signed)
Post Partum Day 1 Subjective: no complaints, tolerating PO, and Foley in place  Objective: Blood pressure 114/74, pulse 75, temperature 98.1 F (36.7 C), temperature source Oral, resp. rate 16, height 5\' 7"  (1.702 m), weight 90.9 kg, last menstrual period 03/30/2021, SpO2 99 %, unknown if currently breastfeeding.  Physical Exam:  General: alert, cooperative, and no distress Lochia: appropriate Uterine Fundus: firm DVT Evaluation: No evidence of DVT seen on physical exam.  Recent Labs    12/14/21 1611 12/14/21 1945  HGB 10.4* 10.7*  HCT 32.1* 31.0*   CBC    Component Value Date/Time   WBC 19.2 (H) 12/14/2021 1945   RBC 3.68 (L) 12/14/2021 1945   HGB 10.7 (L) 12/14/2021 1945   HGB 10.1 (L) 12/11/2021 1538   HGB 13.0 11/14/2015 0000   HCT 31.0 (L) 12/14/2021 1945   HCT 30.3 (L) 12/11/2021 1538   HCT 39 11/14/2015 0000   PLT 169 12/14/2021 1945   PLT 253 12/11/2021 1538   PLT 250 11/14/2015 0000   MCV 84.2 12/14/2021 1945   MCV 89 12/11/2021 1538   MCH 29.1 12/14/2021 1945   MCHC 34.5 12/14/2021 1945   RDW 16.6 (H) 12/14/2021 1945   RDW 13.2 12/11/2021 1538   LYMPHSABS 1.3 06/13/2021 1648   MONOABS 0.4 05/23/2021 1347   EOSABS 0.0 06/13/2021 1648   BASOSABS 0.0 06/13/2021 1648    Assessment/Plan: Doing well s/p PP hemorrhage and transfusion, curettage and Jada placement. Remove Foley and encourage ambulation.   LOS: 1 day   Emeterio Reeve 12/15/2021, 9:13 AM

## 2021-12-15 NOTE — Anesthesia Postprocedure Evaluation (Signed)
Anesthesia Post Note  Patient: Madison Hood  Procedure(s) Performed: DILATATION AND EVACUATION     Patient location during evaluation: PACU Anesthesia Type: Epidural Level of consciousness: awake, awake and alert and oriented Pain management: pain level controlled Vital Signs Assessment: post-procedure vital signs reviewed and stable Respiratory status: spontaneous breathing, nonlabored ventilation, respiratory function stable and patient connected to nasal cannula oxygen Cardiovascular status: stable Postop Assessment: no headache, no backache, epidural receding, patient able to bend at knees and no signs of nausea or vomiting Anesthetic complications: no Comments: Received 4 units pRBC, 2 units FFP in OR for ongoing blood loss.  Able to wean phenylephrine gtt to off in PACU with stable vital signs.   No notable events documented.  Last Vitals:  Vitals:   12/15/21 0455 12/15/21 0600  BP: 125/82   Pulse: 66   Resp: 17 16  Temp: 36.6 C   SpO2: 99%     Last Pain:  Vitals:   12/15/21 0600  TempSrc:   PainSc: 3    Pain Goal:                   Cecile Hearing

## 2021-12-15 NOTE — Lactation Note (Addendum)
This note was copied from a baby's chart. Lactation Consultation Note  Patient Name: Madison Hood [id number] Date: 12/15/2021 Reason for consult: Follow-up assessment;Mother's request;Early term 37-38.6wks Age:32 hours P2, ETI female infant, less than 6 lbs at birth, with -2% weight loss. Mom's current feeding choice is breastfeeding and supplementing with 22 kcal formula. LC entered room, mom interested in latching infant at the breast, mom was given green sheet ( LPTI) by other LC previously. Mom latched infant on her left breast using the football hold position, infant latched with depth, swallows observed, infant breastfeed for 11 minutes. Mom had not been using DEBP consistently, LC reviewed importance of using the DEBP to help with breast stimulation and establish mom's milk supply. Dad supplemented infant with 7 mls of Similac Neosure 22 kcal formula while mom was using DEBP, mom was still pumping and had 10 mls of EBM in breast flange/ bottles. Parents understand breastmilk is good at room temperature for 4 hours whereas formula must be used within 1 hour once opened. Mom's current plan: 1- Mom will continue to follow LPTI guidelines  given by previous LC 2- Mom will latch infant first for every feeding by cues, 8 to 12 and limit total feeding to 30 minutes or less. 3- After latching infant at the breast, mom will offer her EBM first, before supplementing infant with formula. 4- Mom knows to call RN/LC if she has any breastfeeding questions, concerns or further assistance with latching infant at breast.   Maternal Data    Feeding Mother's Current Feeding Choice: Breast Milk and Formula Nipple Type: Nfant Slow Flow (purple)  LATCH Score Latch: Grasps breast easily, tongue down, lips flanged, rhythmical sucking. (Infant was actively feeding.)  Audible Swallowing: Spontaneous and intermittent  Type of Nipple: Everted at rest and after stimulation  Comfort  (Breast/Nipple): Soft / non-tender  Hold (Positioning): Assistance needed to correctly position infant at breast and maintain latch.  LATCH Score: 9   Lactation Tools Discussed/Used Tools: Pump Flange Size: 24 Breast pump type: Double-Electric Breast Pump Pump Education: Setup, frequency, and cleaning;Milk Storage Reason for Pumping: Infant previously was not latching at breast, ETI, less than 6 lbs. Pumping frequency: Mom knows to pump every 3 hours for 15 minutes on inital setting. Pumped volume: 10 mL (Mom was still using DEBP when LC left the room.)  Interventions Interventions: Skin to skin;Breast compression;Adjust position;Support pillows;Position options;Education;DEBP  Discharge    Consult Status Consult Status: Follow-up Date: 12/15/21 Follow-up type: In-patient    Danelle Earthly 12/15/2021, 9:38 PM

## 2021-12-15 NOTE — Progress Notes (Addendum)
CSW acknowledges consult, but notice MOB is on Mag. CSW will follow up once Mag is d/c. ° °Wilfrid Hyser, MSW, LCSW-A °Clinical Social Worker- Weekends °[phone]  °

## 2021-12-16 MED ORDER — IBUPROFEN 600 MG PO TABS: 600.0000 mg | ORAL_TABLET | Freq: Four times a day (QID) | ORAL | 0 refills | Status: AC

## 2021-12-16 MED ORDER — NIFEDIPINE ER 30 MG PO TB24: 30.0000 mg | ORAL_TABLET | Freq: Every day | ORAL | 1 refills | Status: AC

## 2021-12-16 MED ORDER — NIFEDIPINE ER OSMOTIC RELEASE 30 MG PO TB24
30.0000 mg | ORAL_TABLET | Freq: Every day | ORAL | Status: DC
  Administered 2021-12-16: 30 mg via ORAL
  Filled 2021-12-16: qty 1

## 2021-12-16 NOTE — Progress Notes (Signed)
CSW met with MOB to complete consult for mental health. CSW observed MOB resting in bed, and FOB sitting on couch bonding with infant. MOB gave CSW verbal consent to complete consult while FOB was present. CSW explained role, and reason for consult. MOB was pleasant, and polite during engagement with CSW. MOB reported, she experience anxiety, and depression at the beginning of her pregnancy. MOB reported, she experience being anxious, worrying, over thinking, racing thoughts, isolation, and not wanting to get out of bed. MOB reported, history of therapy through her OBGYN. MOB denied any history of psychotropic medication. CSW encourage MOB to implement healthy coping skills when symptoms arises.   CSW provided education regarding the baby blues period vs. perinatal mood disorders, discussed treatment and gave resources for mental health follow up if concerns arise. CSW recommends self- evaluation during the postpartum time period using the New Mom Checklist from Postpartum Progress and encouraged MOB to contact a medical professional if symptoms are noted at any time.   MOB reported, since delivery she feels, "much better". MOB reported, FOB is very supportive. MOB denied SI, HI when CSW assessed for safety.   MOB reported, there are no transportation barriers to follow up infant's care. MOB reported, she has all essentials needed to care for infant. MOB reported, infant has a car seat, and bassinet. MOB denied any additional barriers.     CSW provided education on sudden infant death syndrome (SIDS).  CSW provided perinatal mood disorders resources.  CSW identifies no further need for intervention or barriers to discharge at this time.  Darcus Austin, MSW, LCSW-A Clinical Social Worker- Weekends [phone]

## 2021-12-16 NOTE — Lactation Note (Addendum)
This note was copied from a baby's chart. Lactation Consultation Note  Patient Name: Madison Hood [id number] Date: 12/16/2021 Reason for consult: Follow-up assessment;Early term 37-38.6wks;Infant weight loss;Infant < 6lbs;Other (Comment) (baby Latched as LC entered the room with depth, swallows and mom comfortable at the breast except for cramping.) Age:32 hours Baby fed 20 mins, and mom supplemented with her EBM after wards with a bottle. Baby tolerating well.  LC reviewed BF D/C teaching for a ET / Less than 6 pound baby and to continue following the same [place] feeding plan she has been doing in the hospital.  LC stressed the importance of feeding with feeding cues and by 3 hours.  Offer the breast 1st, if to sluggish to latch , try and Appetizer of EBM or formula, then latch, if still sluggish just feed the bottle.  Post pump both breast for 10 -15 mins / save milk for the next feeding.  LC offered to request an [place] O/P appt and mom preferred to call herself.  [place] praised mom for how well she is doing with breast feeding and the [facility] plan.  Per mom and dad plan to purchase a DEBP.   Maternal Data    Feeding Mother's Current Feeding Choice: Breast Milk and Formula Nipple Type: Nfant Slow Flow (purple)  LATCH Score - baby latched with depth on the left breast as LC entered and per mom comfortable. Multiple swallows noted. Breast fed 20 mins .                     Lactation Tools Discussed/Used Tools: Pump Flange Size: 24 Breast pump type: Double-Electric Breast Pump Pump Education: Milk Storage  Interventions    Discharge Discharge Education: Engorgement and breast care;Warning signs for feeding baby  Consult Status Completed . 1/15 /2023       Jerlyn Ly Shamon Cothran 12/16/2021, 12:58 PM

## 2021-12-16 NOTE — Progress Notes (Signed)
I was called to bedside by CNM [name] due to a retained placenta. When I entered the room it was also noted to have at least 500 CC if blood in the under buttock drape. It had been at least 15 minutes since the delivery of the infant and the bleeding need more management including expedited delivery of the placenta. CNM noted adherent portion of placenta and provided warm handoff of care.  I introduced myself to the patient and family and explained the situation which included greater than expected bleeding and her placenta was not detaching as expected.  I discussed manual extraction. The patient had a working epidural and agreed to procedure  I asked for Anceph to be ordered.   When entering the body of the uterus it was apparent there was a adherent portion of the placenta the right fundus. I was able to remove the majority of the placenta manually but unable to get a good plane on this right fundal portion.  After the bulk of the placenta was removed I was able to remove some additional remnants and CNM [name] assisted on the back table to identify whether there were large portions missing. Patient continued to have bleeding and I called a Code Hemorrhage and Code MTP. Patient was having nausea and low blood pressure in the labor room.   Patient had received cytotec, pitocin and TXA in the delivery room.  We were unable to place [place] or [place] at that time due to the concern for placental remnants. The decision was made to bring the patient to the operating room for and emergent D&C due to the abnormally adherent placenta and given amount of blood loss which was estimated to be over 2L at that point.  I discussed this with the support person (FOB) at the bedside who verbally agreed. Patient was informed as well but given her low blood pressure overall critical nature we were unable to formally consent her. We paged my colleague Emeterio Reeve to meet [place] in the OR.  Anesthesia was also at  bedside and actively involved.   Caren Macadam, MD, MPH, ABFM, [facility] Attending Physician Center for [facility]

## 2021-12-17 LAB — BPAM RBC
Blood Product Expiration Date: 202302082359
Blood Product Expiration Date: 202302082359
Blood Product Expiration Date: 202302082359
Blood Product Expiration Date: 202302092359
Blood Product Expiration Date: 202302092359
Blood Product Expiration Date: 202302092359
Blood Product Expiration Date: 202302112359
Blood Product Expiration Date: 202302112359
ISSUE DATE / TIME: 202301131618
ISSUE DATE / TIME: 202301131618
ISSUE DATE / TIME: 202301131627
ISSUE DATE / TIME: 202301131627
ISSUE DATE / TIME: 202301151135
ISSUE DATE / TIME: 202301151405
ISSUE DATE / TIME: 202301152159
Unit Type and Rh: 6200
Unit Type and Rh: 6200
Unit Type and Rh: 6200
Unit Type and Rh: 6200
Unit Type and Rh: 6200
Unit Type and Rh: 6200
Unit Type and Rh: 6200
Unit Type and Rh: 6200

## 2021-12-17 LAB — TYPE AND SCREEN
ABO/RH(D): A POS
Antibody Screen: NEGATIVE
Unit division: 0
Unit division: 0
Unit division: 0
Unit division: 0
Unit division: 0
Unit division: 0
Unit division: 0
Unit division: 0

## 2021-12-18 LAB — SURGICAL PATHOLOGY

## 2021-12-21 ENCOUNTER — Institutional Professional Consult (permissible substitution): Payer: Self-pay | Admitting: Licensed Clinical Social Worker

## 2021-12-21 ENCOUNTER — Ambulatory Visit (INDEPENDENT_AMBULATORY_CARE_PROVIDER_SITE_OTHER): Payer: Self-pay

## 2021-12-21 ENCOUNTER — Inpatient Hospital Stay (HOSPITAL_COMMUNITY)
Admission: AD | Admit: 2021-12-21 | Discharge: 2021-12-21 | Disposition: A | Discharge status: Home / Self Care | Payer: Self-pay | Attending: Obstetrics & Gynecology | Admitting: Obstetrics & Gynecology

## 2021-12-21 ENCOUNTER — Inpatient Hospital Stay (HOSPITAL_COMMUNITY): Payer: Self-pay

## 2021-12-21 ENCOUNTER — Encounter (HOSPITAL_COMMUNITY): Payer: Self-pay | Admitting: Obstetrics & Gynecology

## 2021-12-21 ENCOUNTER — Other Ambulatory Visit: Payer: Self-pay

## 2021-12-21 VITALS — BP 130/87 | HR 89 | Wt 172.0 lb

## 2021-12-21 DIAGNOSIS — O9089 Other complications of the puerperium, not elsewhere classified: Secondary | ICD-10-CM | POA: Insufficient documentation

## 2021-12-21 DIAGNOSIS — R519 Headache, unspecified: Secondary | ICD-10-CM | POA: Insufficient documentation

## 2021-12-21 DIAGNOSIS — O119 Pre-existing hypertension with pre-eclampsia, unspecified trimester: Secondary | ICD-10-CM

## 2021-12-21 DIAGNOSIS — O115 Pre-existing hypertension with pre-eclampsia, complicating the puerperium: Secondary | ICD-10-CM

## 2021-12-21 DIAGNOSIS — K5900 Constipation, unspecified: Secondary | ICD-10-CM | POA: Insufficient documentation

## 2021-12-21 DIAGNOSIS — H538 Other visual disturbances: Secondary | ICD-10-CM | POA: Insufficient documentation

## 2021-12-21 DIAGNOSIS — Z0130 Encounter for examination of blood pressure without abnormal findings: Secondary | ICD-10-CM

## 2021-12-21 DIAGNOSIS — O1405 Mild to moderate pre-eclampsia, complicating the puerperium: Secondary | ICD-10-CM | POA: Insufficient documentation

## 2021-12-21 HISTORY — DX: Gestational (pregnancy-induced) hypertension without significant proteinuria, unspecified trimester: O13.9

## 2021-12-21 LAB — CBC
HCT: 32.4 % — ABNORMAL LOW (ref 36.0–46.0)
Hemoglobin: 10.6 g/dL — ABNORMAL LOW (ref 12.0–15.0)
MCH: 29 pg (ref 26.0–34.0)
MCHC: 32.7 g/dL (ref 30.0–36.0)
MCV: 88.5 fL (ref 80.0–100.0)
Platelets: 370 10*3/uL (ref 150–400)
RBC: 3.66 MIL/uL — ABNORMAL LOW (ref 3.87–5.11)
RDW: 16.3 % — ABNORMAL HIGH (ref 11.5–15.5)
WBC: 11.5 10*3/uL — ABNORMAL HIGH (ref 4.0–10.5)
nRBC: 0 % (ref 0.0–0.2)

## 2021-12-21 LAB — COMPREHENSIVE METABOLIC PANEL
ALT: 32 U/L (ref 0–44)
AST: 34 U/L (ref 15–41)
Albumin: 3 g/dL — ABNORMAL LOW (ref 3.5–5.0)
Alkaline Phosphatase: 90 U/L (ref 38–126)
Anion gap: 10 (ref 5–15)
BUN: 12 mg/dL (ref 6–20)
CO2: 24 mmol/L (ref 22–32)
Calcium: 9.1 mg/dL (ref 8.9–10.3)
Chloride: 104 mmol/L (ref 98–111)
Creatinine, Ser: 0.74 mg/dL (ref 0.44–1.00)
GFR, Estimated: >60 mL/min (ref >60)
Glucose, Bld: 88 mg/dL (ref 70–99)
Potassium: 4.3 mmol/L (ref 3.5–5.1)
Sodium: 138 mmol/L (ref 135–145)
Total Bilirubin: 0.4 mg/dL (ref 0.3–1.2)
Total Protein: 6.5 g/dL (ref 6.5–8.1)

## 2021-12-21 IMAGING — MR MR [PERSON_NAME] HEAD
3 series · 42 of 48 positions shown · non-contrast
Comparison: None.

CLINICAL DATA: New onset headache and blurred vision

EXAM:
MRI HEAD WITHOUT CONTRAST
MRV HEAD WITHOUT CONTRAST
TECHNIQUE: Multiplanar, multiecho pulse sequences of the brain and surrounding
structures were obtained without intravenous contrast. Angiographic
images of the intracranial venous structures were obtained using MRV
technique without intravenous contrast.

[Series 18: tof_fl2d_paracor · coronal · 2.0mm · 0.98mm/px · 12 of 128 slices shown]
[im 1/128]
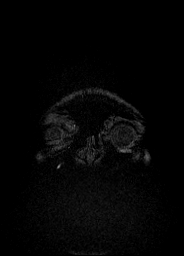
[im 12/128]
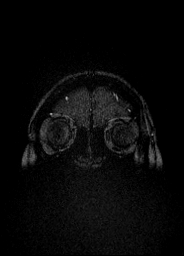
[im 24/128]
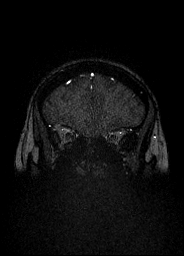
[im 35/128]
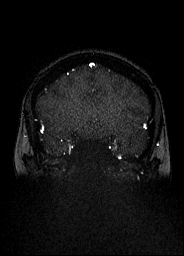
[im 47/128]
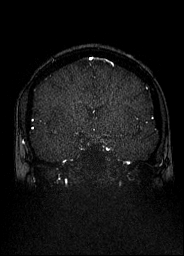
[im 58/128]
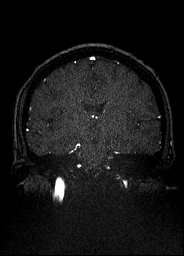
[im 70/128]
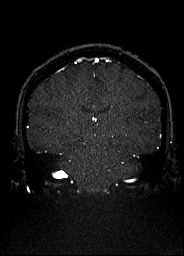
[im 81/128]
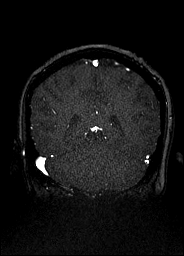
[im 93/128]
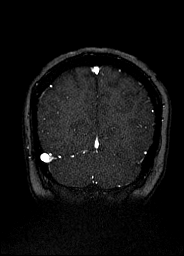
[im 104/128]
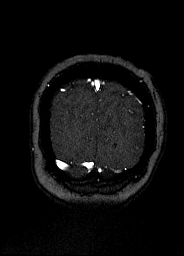
[im 116/128]
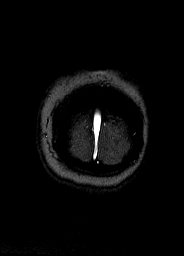
[im 128/128]
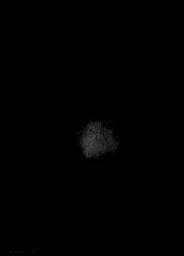

[Series 22: venous inhance coronal · coronal · portal-venous · 0.9mm · 0.57mm/px · 18 of 192 slices shown]
[im 1/192]
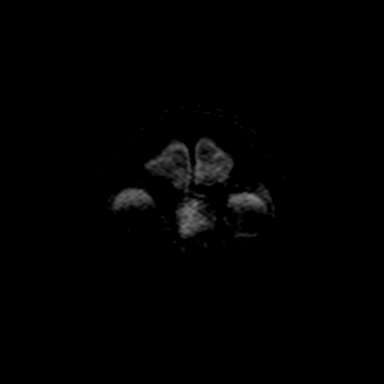
[im 12/192]
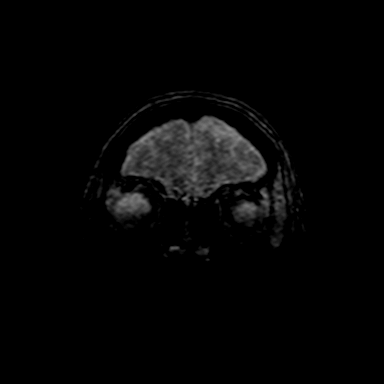
[im 23/192]
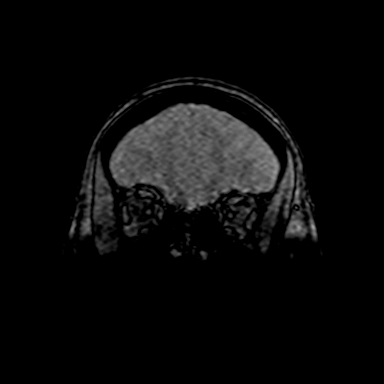
[im 34/192]
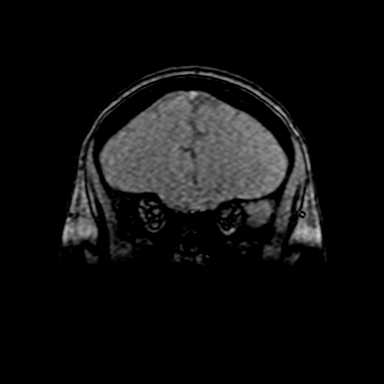
[im 45/192]
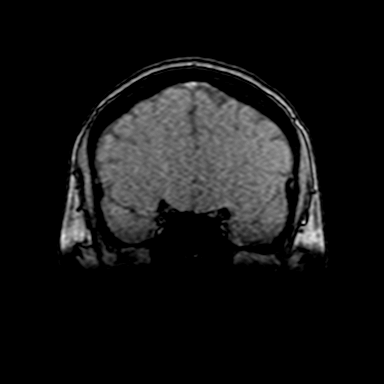
[im 57/192]
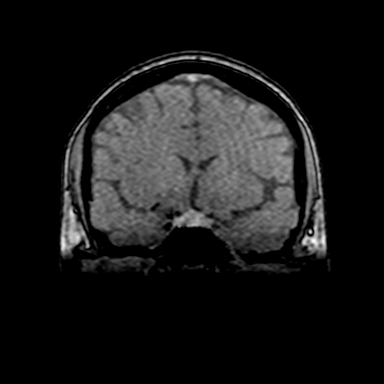
[im 68/192]
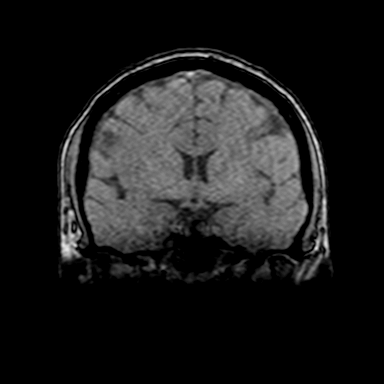
[im 79/192]
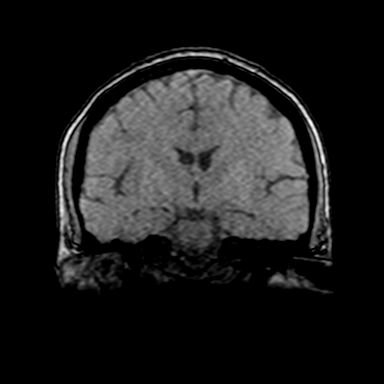
[im 90/192]
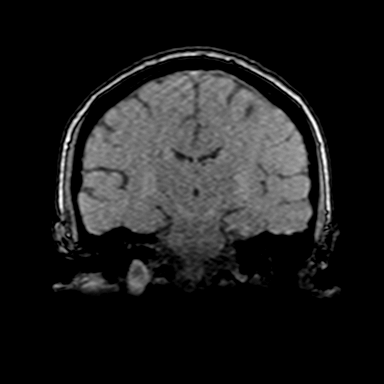
[im 102/192]
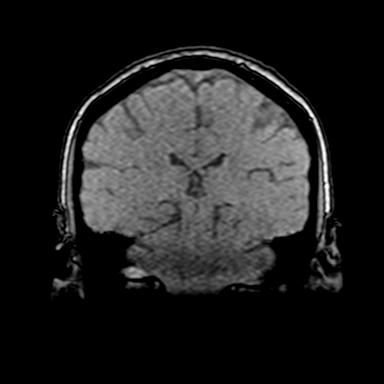
[im 113/192]
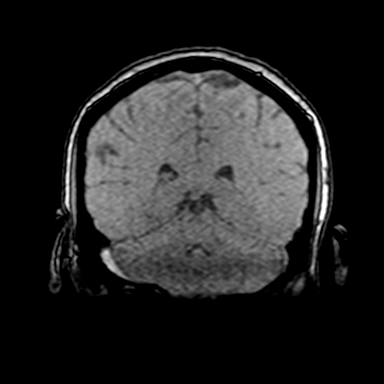
[im 124/192]
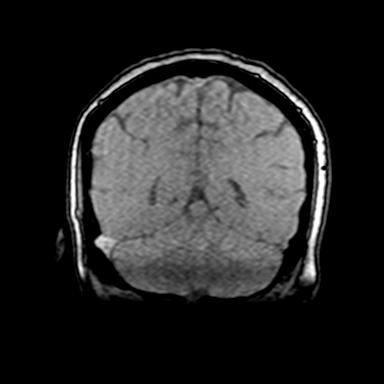
[im 135/192]
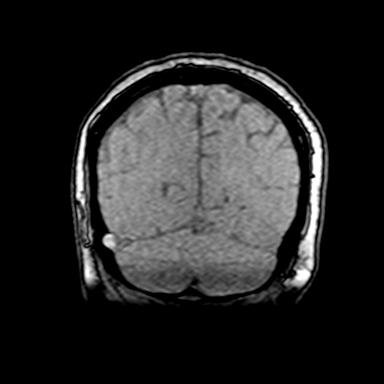
[im 147/192]
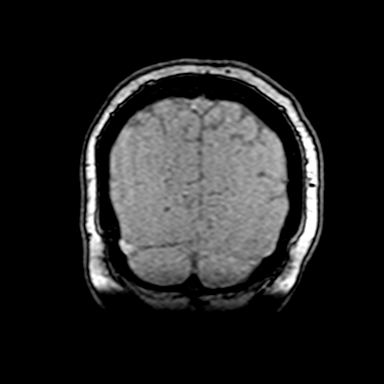
[im 158/192]
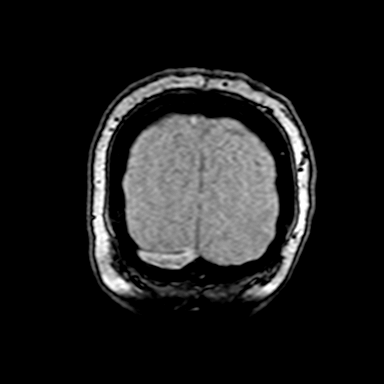
[im 169/192]
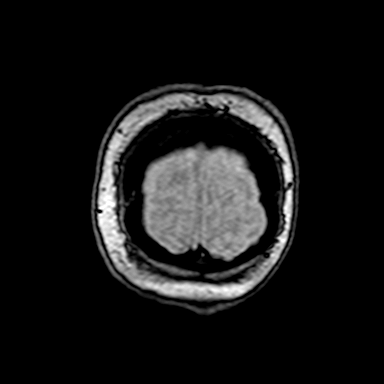
[im 180/192]
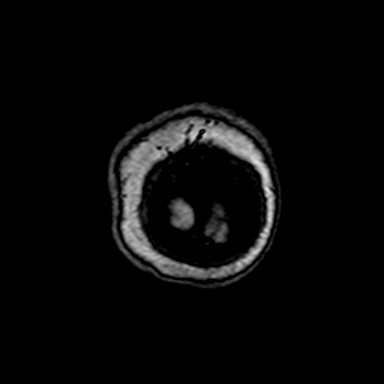
[im 192/192]
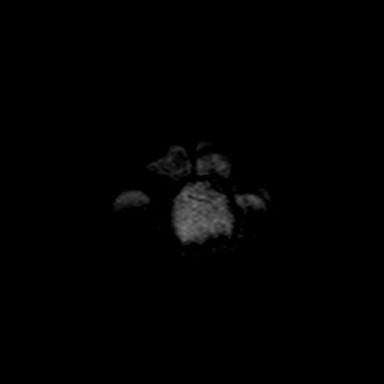

[Series 23: venous inhance coronal_msum · coronal · portal-venous · 0.9mm · 0.57mm/px · 12 of 189 slices shown]
[im 1/189]
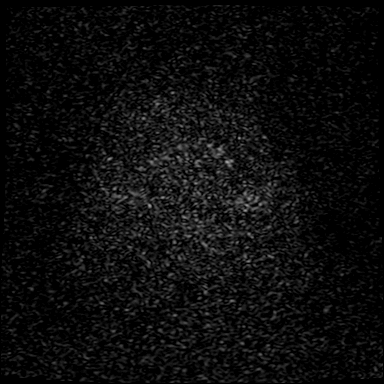
[im 12/189]
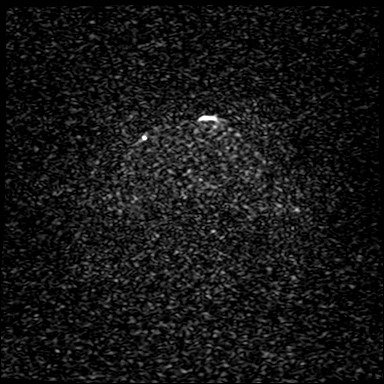
[im 23/189]
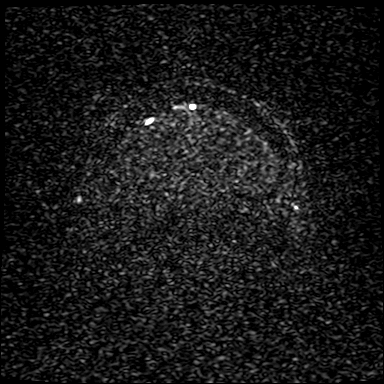
[im 34/189]
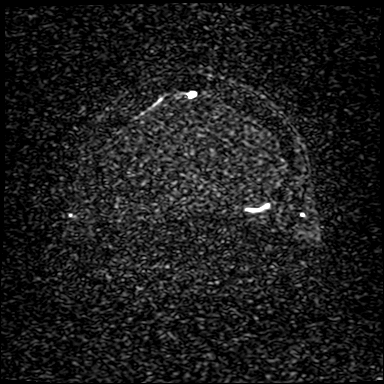
[im 56/189]
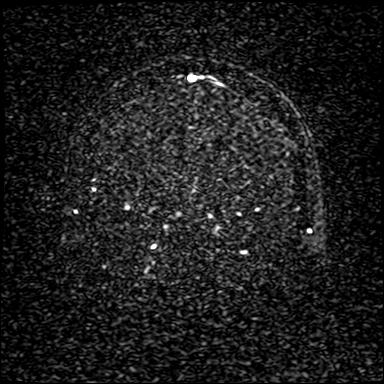
[im 78/189]
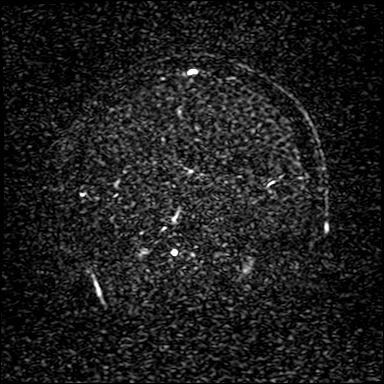
[im 100/189]
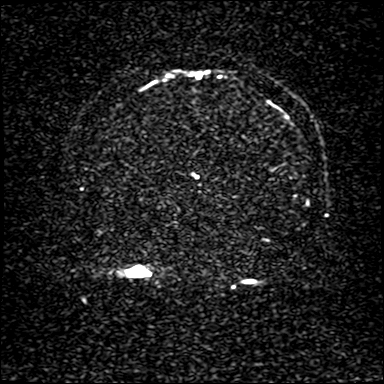
[im 111/189]
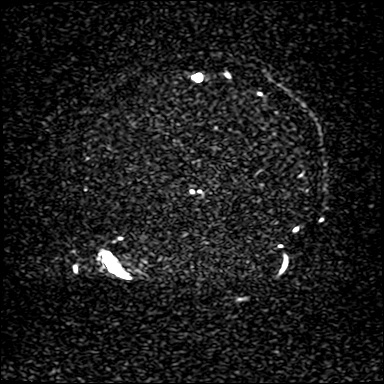
[im 133/189]
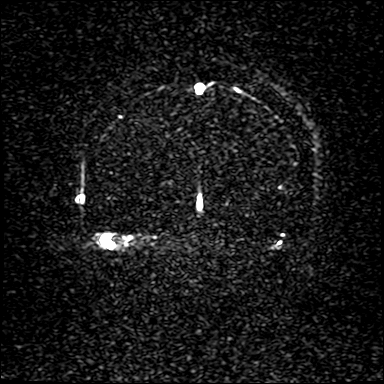
[im 155/189]
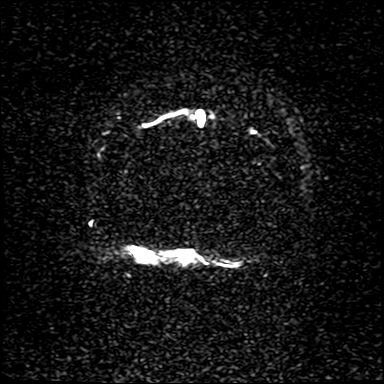
[im 166/189]
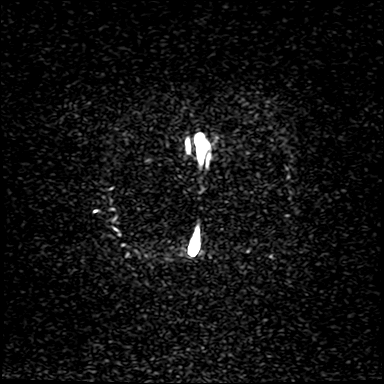
[im 177/189]
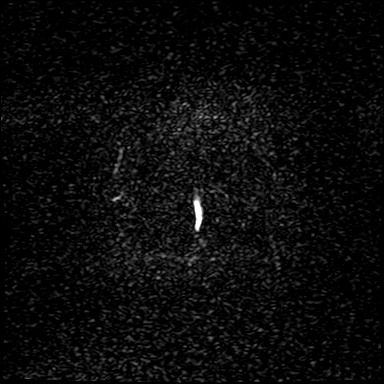

[42 of 48 positions shown; findings below may reference images not displayed]

FINDINGS: MR HEAD FINDINGS

Brain: No acute infarction, hemorrhage, hydrocephalus, extra-axial
collection or mass lesion.

Normal white matter.  Negative for demyelinating disease.

Vascular: Normal arterial flow voids.

Skull and upper cervical spine: No focal lesion.

Sinuses/Orbits: Paranasal sinuses clear.  Negative orbit

Other: None

MRV HEAD FINDINGS

Right transverse sinus dominant. Hypoplastic left transverse sinus.
No stenosis or filling defect. Superior sagittal sinus patent.
Straight sinus and internal cerebral veins patent.
IMPRESSION: Negative MRI head

Negative MR venogram of the head

## 2021-12-21 IMAGING — MR MR HEAD W/O CM
12 of 13 series · 44 of 48 positions shown · non-contrast
Comparison: None.

CLINICAL DATA: New onset headache and blurred vision

EXAM:
MRI HEAD WITHOUT CONTRAST
MRV HEAD WITHOUT CONTRAST
TECHNIQUE: Multiplanar, multiecho pulse sequences of the brain and surrounding
structures were obtained without intravenous contrast. Angiographic
images of the intracranial venous structures were obtained using MRV
technique without intravenous contrast.

[Series 5: DWI · axial · 3.0mm · 0.88mm/px · z∈[-139,-2]mm · 9 of 100 slices shown (1 of 4)]
[im 1/100]
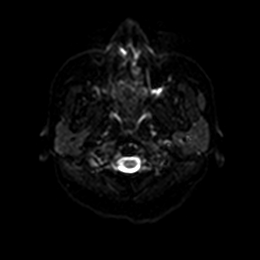
[im 13/100]
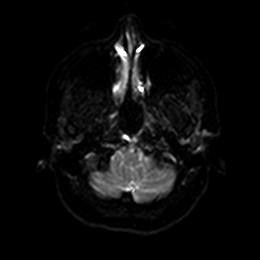
[im 25/100]
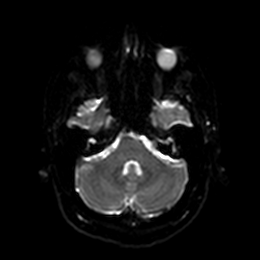
[im 38/100]
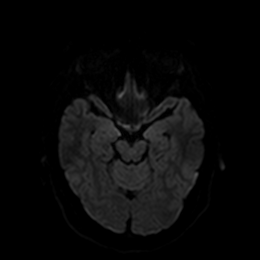
[im 50/100]
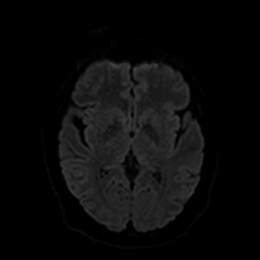
[im 62/100]
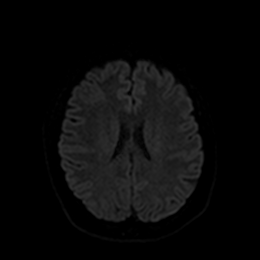
[im 75/100]
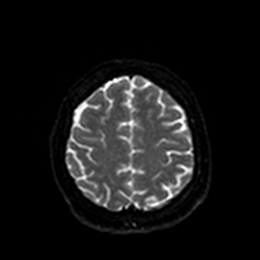
[im 87/100]
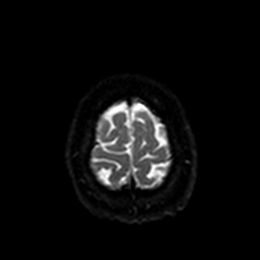
[im 100/100]
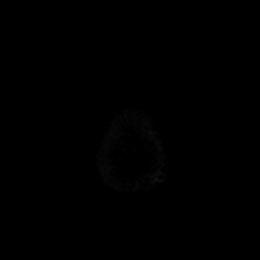

[Series 6: DWI · axial · 3.0mm · 0.88mm/px · z∈[-139,-2]mm · 4 of 49 slices shown (2 of 4)]
[im 1/49]
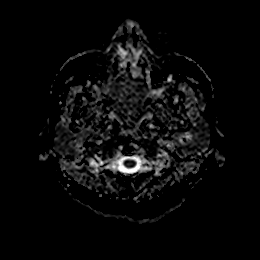
[im 17/49]
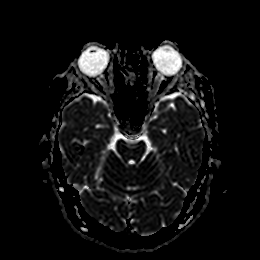
[im 33/49]
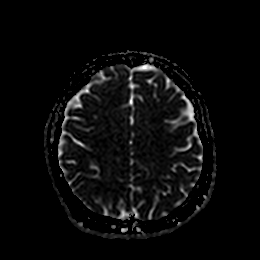
[im 49/49]
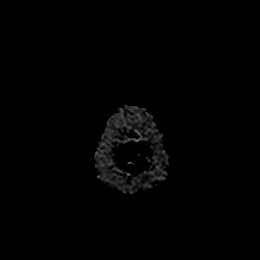

[Series 7: DWI · coronal · 4.0mm · 0.88mm/px · 5 of 64 slices shown (3 of 4)]
[im 1/64]
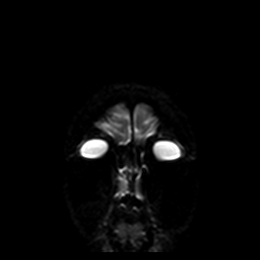
[im 16/64]
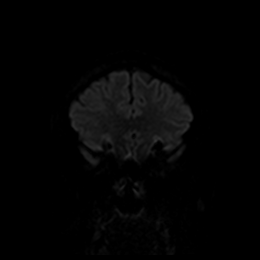
[im 32/64]
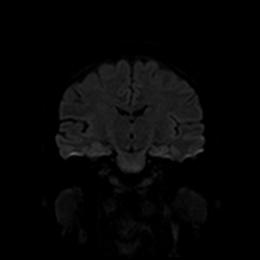
[im 48/64]
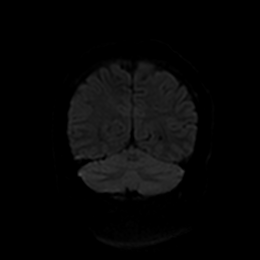
[im 64/64]
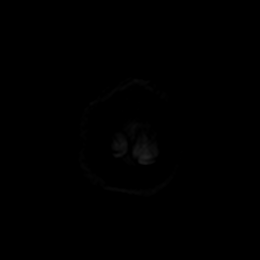

[Series 8: DWI · coronal · 4.0mm · 0.88mm/px · 2 of 32 slices shown (4 of 4)]
[im 1/32]
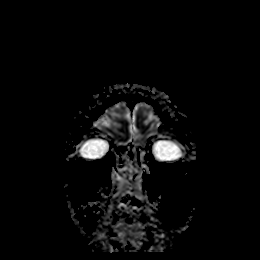
[im 32/32]
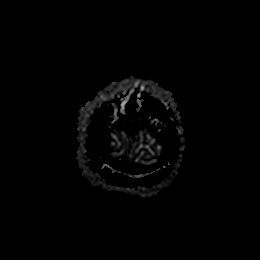

[Series 9: T1 · sagittal · 5.0mm · 0.75mm/px · 2 of 25 slices shown]
[im 1/25]
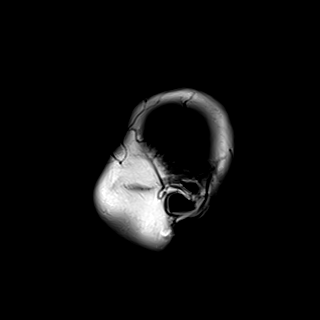
[im 25/25]
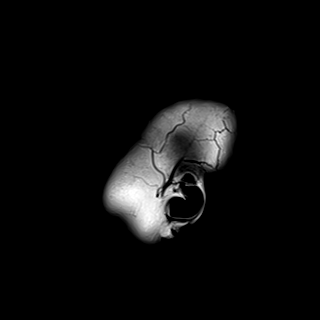

[Series 10: T2 · axial · 5.0mm · 0.72mm/px · z∈[-137,-4]mm · 2 of 25 slices shown (1 of 2)]
[im 1/25]
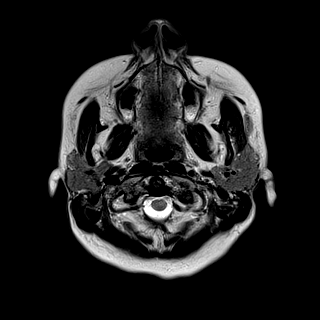
[im 25/25]
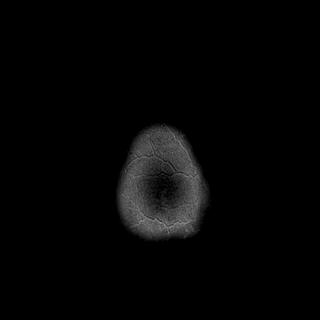

[Series 11: FLAIR · axial · 5.0mm · 0.45mm/px · z∈[-137,-4]mm · 2 of 25 slices shown]
[im 1/25]
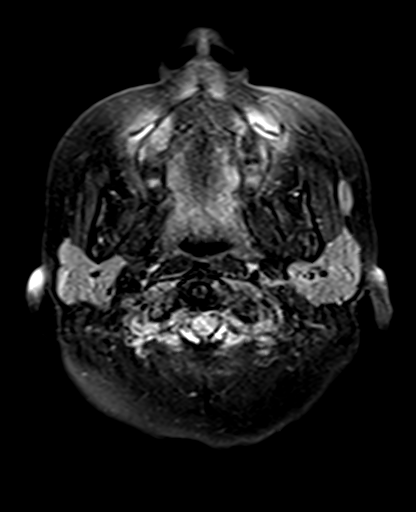
[im 25/25]
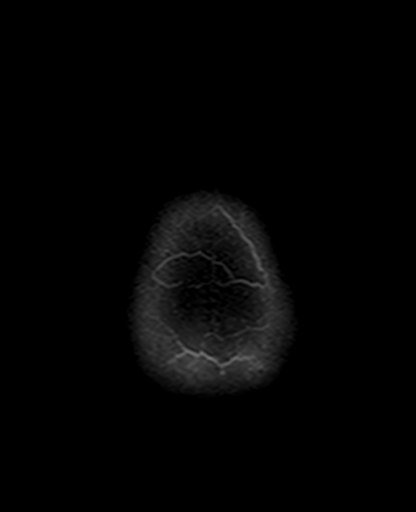

[Series 12: mag_images · axial · 3.0mm · 0.90mm/px · z∈[-147,+6]mm · 4 of 56 slices shown]
[im 1/56]
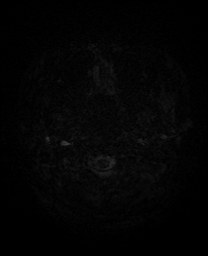
[im 19/56]
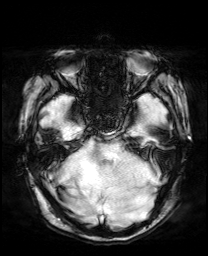
[im 37/56]
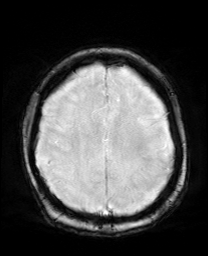
[im 56/56]
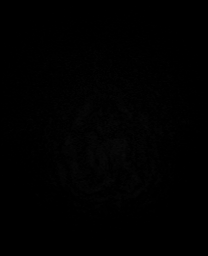

[Series 13: pha_images · axial · 3.0mm · 0.90mm/px · z∈[-147,+3]mm · 4 of 55 slices shown]
[im 1/55]
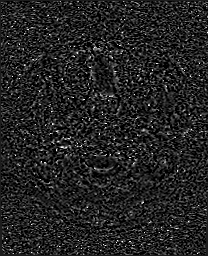
[im 19/55]
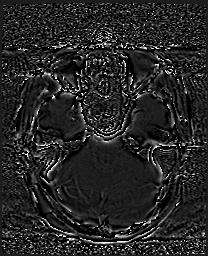
[im 37/55]
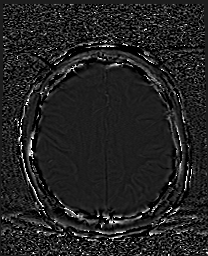
[im 55/55]
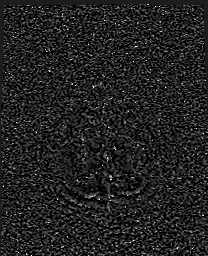

[Series 14: swi_images · axial · 3.0mm · 0.90mm/px · z∈[-147,+6]mm · 4 of 56 slices shown]
[im 1/56]
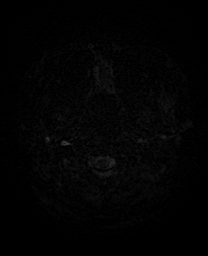
[im 19/56]
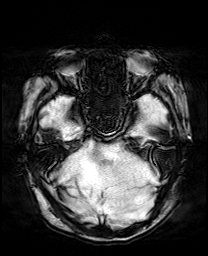
[im 37/56]
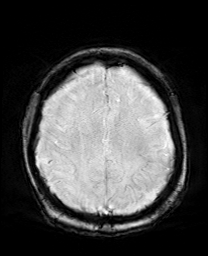
[im 56/56]
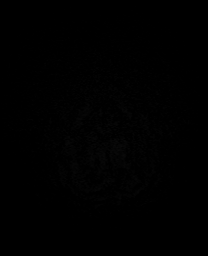

[Series 15: mip_images(sw) · axial · 24.0mm · 0.90mm/px · z∈[-137,-4]mm · 4 of 49 slices shown]
[im 1/49]
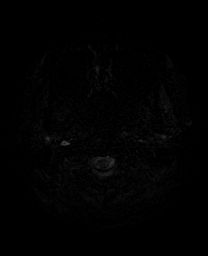
[im 17/49]
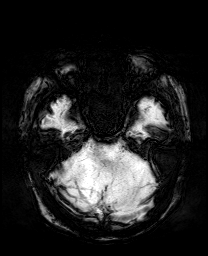
[im 33/49]
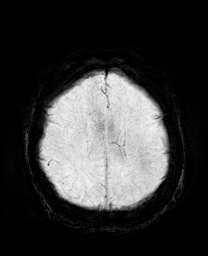
[im 49/49]
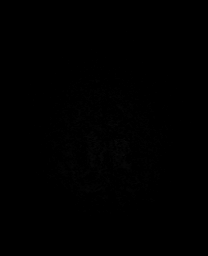

[Series 17: T2 · coronal · 5.0mm · 0.34mm/px · 2 of 29 slices shown (2 of 2)]
[im 1/29]
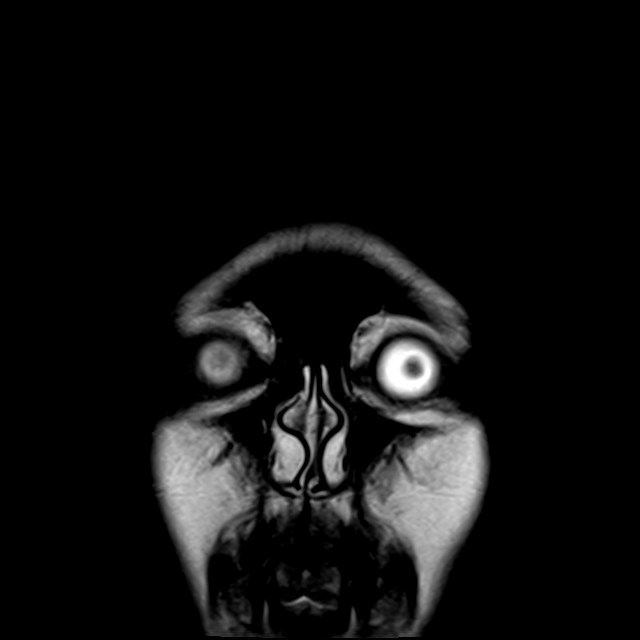
[im 29/29]
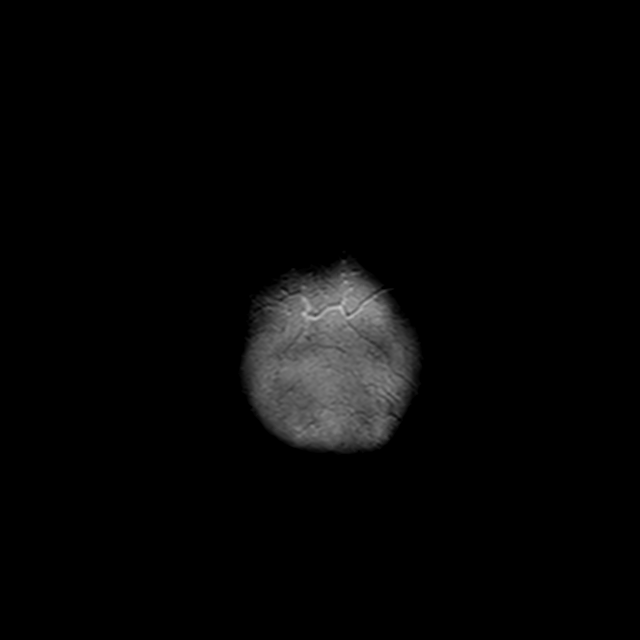

[44 of 48 positions shown; findings below may reference images not displayed]

FINDINGS: MR HEAD FINDINGS

Brain: No acute infarction, hemorrhage, hydrocephalus, extra-axial
collection or mass lesion.

Normal white matter.  Negative for demyelinating disease.

Vascular: Normal arterial flow voids.

Skull and upper cervical spine: No focal lesion.

Sinuses/Orbits: Paranasal sinuses clear.  Negative orbit

Other: None

MRV HEAD FINDINGS

Right transverse sinus dominant. Hypoplastic left transverse sinus.
No stenosis or filling defect. Superior sagittal sinus patent.
Straight sinus and internal cerebral veins patent.
IMPRESSION: Negative MRI head

Negative MR venogram of the head

## 2021-12-21 MED ORDER — METOCLOPRAMIDE HCL 10 MG PO TABS
10.0000 mg | ORAL_TABLET | Freq: Once | ORAL | Status: AC
  Administered 2021-12-21: 10 mg via ORAL
  Filled 2021-12-21: qty 1

## 2021-12-21 MED ORDER — ACETAMINOPHEN 500 MG PO TABS
1000.0000 mg | ORAL_TABLET | Freq: Once | ORAL | Status: AC
  Administered 2021-12-21: 1000 mg via ORAL
  Filled 2021-12-21: qty 2

## 2021-12-21 MED ORDER — METOCLOPRAMIDE HCL 10 MG PO TABS: 10.0000 mg | ORAL_TABLET | Freq: Three times a day (TID) | ORAL | 1 refills | Status: AC

## 2021-12-21 NOTE — MAU Note (Signed)
Presents stating she was sent from office for BP evaluation.  States she currently has blurred vision, but no H/A.  Reports has had H/A & blurred vision since delivery.  Reports H/A on Monday was the worst and wasn't relieved with meds.   S/P NVD 12/14/2021.  Dx Pre E during pregnancy.

## 2021-12-21 NOTE — Discharge Instructions (Signed)

## 2021-12-21 NOTE — MAU Provider Note (Signed)
History     CSN: [id number]  Arrival date and time: 12/21/21 1123   Event Date/Time   First Provider Initiated Contact with Patient 12/21/21 1410      Chief Complaint  Patient presents with   BP Evaluation   [name] is a 31 y.o. [id number] at ppartum s/p NSVD 12/14/2021 who presents to MAU for preeclampsia evaluation after she was seen in the office for a BP check, had normal blood pressure, but was experiencing s/sx of pre-eclampsia and was sent to MAU for further evaluation. Pt received magnesium during delivery stay in the hospital and has been taking Procardia daily since discharge from the hospital. Patient reports she was sent from the office because of elevated BP, but recorded BP in chart 130/87.  Patient reports she was sent to the hospital because she is currently experiencing blurry vision since delivery. Patient states she can see things clearly that are large and do not have bright light associated with them, but when bright light is involved or the letters are too small, then her vision is blurry. But patient reports she never had any issues with vision during her pregnancy or prior to pregnancy/delivery. Patient states this issue has not gotten worse since delivery, but has also not improved.  Patient also reports current headache. Patient rates current headache as 7/10. Patient reports she has been getting headaches since delivery, and she never struggled with headaches prior to pregnancy. Patient reports on Monday she had a very severe headache that did not go away with Tylenol. Patient reports generalized head pain at this time. Patient states when she gets these headaches, her vision does become blurrier. Patient states she took 600mg  of ibuprofen at 10AM this morning, but reports this did not help her headache. Patient reports Tylenol does help her headache, and takes it away entirely, but takes a long time for it to go away.  Patient also reports constipation  and states she was not prescribed anything for constipation prior to leaving the hospital. Patient states she has not had a bowel movement since last Thursday. Patient states she has not had any passage of stool since that time.  Pt denies seeing spots, N/V, epigastric pain, swelling in face and hands, sudden weight gain. Pt denies chest pain and SOB.  Pt denies diarrhea, or urinary problems. Pt denies fever, chills, fatigue, sweating or changes in appetite. Pt denies dizziness, light-headedness, weakness.  Blood Type? A Positive Allergies? NKDA Current medications? Tylenol, Ibuprofen, Nifedipine, PNV Current PNC & next appt? [facility], no next appt scheduled   OB History     Gravida  4   Para  2   Term  2   Preterm  0   AB  2   Living  2      SAB  2   IAB  0   Ectopic  0   Multiple  0   Live Births  2           Past Medical History:  Diagnosis Date   Anemia    Depression    History of anemia    History of elective abortion    History of spontaneous abortion    Hypertension    Pregnancy induced hypertension     Past Surgical History:  Procedure Laterality Date   denies surgical history     DILATION AND EVACUATION N/A 12/14/2021   Procedure: DILATATION AND EVACUATION;  Surgeon: Federico Flake, MD;  Location: [facility];  Service:  Gynecology;  Laterality: N/A;    Family History  Problem Relation Age of Onset   Heart disease Mother    Hyperlipidemia Mother    Hypertension Mother    Allergies Mother    Hypertension Father    Hyperlipidemia Father    Migraines Sister    Migraines Sister    Cancer Maternal Grandmother     Social History   Tobacco Use   Smoking status: Never   Smokeless tobacco: Never  Vaping Use   Vaping Use: Never used  Substance Use Topics   Alcohol use: Not Currently    Alcohol/week: 1.0 - 5.0 standard drink    Types: 1 Glasses of wine per week    Comment:  Last use one glass of wine 2 days ago.   Drug use: Not  Currently    Types: Marijuana    Comment: stopped in May 2022    Allergies: No Known Allergies  Medications Prior to Admission  Medication Sig Dispense Refill Last Dose   acetaminophen (TYLENOL) 500 MG tablet Take 500 mg by mouth every 6 (six) hours as needed.   12/20/2021 at 1600   ibuprofen (ADVIL) 600 MG tablet Take 1 tablet (600 mg total) by mouth every 6 (six) hours. 30 tablet 0 12/20/2021 at 1800   NIFEdipine (ADALAT CC) 30 MG 24 hr tablet Take 1 tablet (30 mg total) by mouth daily. 30 tablet 1 12/21/2021 at 1000   Prenatal Vit-Fe Fumarate-FA (PRENATAL MULTIVITAMIN) TABS tablet Take 1 tablet by mouth daily at 12 noon.   12/20/2021 at 1200    Review of Systems  Constitutional:  Negative for chills, diaphoresis, fatigue and fever.  Eyes:  Positive for visual disturbance.  Respiratory:  Negative for shortness of breath.   Cardiovascular:  Negative for chest pain.  Gastrointestinal:  Positive for constipation. Negative for abdominal pain, diarrhea, nausea and vomiting.  Genitourinary:  Negative for dysuria, flank pain, frequency, pelvic pain, urgency, vaginal bleeding and vaginal discharge.  Neurological:  Positive for headaches. Negative for dizziness, weakness and light-headedness.   Physical Exam   Blood pressure 124/79, pulse 89, temperature 98.3 F (36.8 C), temperature source Oral, resp. rate 20, height 5\' 7"  (1.702 m), weight 78 kg, last menstrual period 03/30/2021, SpO2 100 %, currently breastfeeding.  Patient Vitals for the past 24 hrs:  BP Temp Temp src Pulse Resp SpO2 Height Weight  12/21/21 1737 124/79 98.3 F (36.8 C) Oral 89 20 100 % -- --  12/21/21 1400 122/89 -- -- 82 -- -- -- --  12/21/21 1346 112/87 -- -- 93 -- -- -- --  12/21/21 1330 125/79 -- -- 86 -- -- -- --  12/21/21 1315 128/79 -- -- 89 -- -- -- --  12/21/21 1300 113/88 -- -- 87 -- -- -- --  12/21/21 1245 115/77 -- -- 87 -- -- -- --  12/21/21 1231 119/75 -- -- 95 -- -- -- --  12/21/21 1215 124/88 -- -- 92  -- -- -- --  12/21/21 1205 126/83 -- -- 90 -- -- -- --  12/21/21 1200 131/86 -- -- 96 -- -- -- --  12/21/21 1155 133/86 -- -- 93 -- -- -- --  12/21/21 1152 129/83 -- -- 97 -- -- -- --  12/21/21 1140 130/83 98.2 F (36.8 C) Oral (!) 104 20 100 % -- --  12/21/21 1129 -- -- -- -- -- -- 5\' 7"  (1.702 m) 78 kg   Physical Exam Vitals and nursing note reviewed.  Constitutional:  General: She is not in acute distress.    Appearance: Normal appearance. She is not ill-appearing, toxic-appearing or diaphoretic.  HENT:     Head: Normocephalic and atraumatic.  Pulmonary:     Effort: Pulmonary effort is normal.  Neurological:     Mental Status: She is alert and oriented to person, place, and time.  Psychiatric:        Mood and Affect: Mood normal.        Behavior: Behavior normal.        Thought Content: Thought content normal.        Judgment: Judgment normal.   Results for orders placed or performed during the hospital encounter of 12/21/21 (from the past 24 hour(s))  CBC     Status: Abnormal   Collection Time: 12/21/21 11:46 AM  Result Value Ref Range   WBC 11.5 (H) 4.0 - 10.5 K/uL   RBC 3.66 (L) 3.87 - 5.11 MIL/uL   Hemoglobin 10.6 (L) 12.0 - 15.0 g/dL   HCT 16.1 (L) 09.6 - 04.5 %   MCV 88.5 80.0 - 100.0 fL   MCH 29.0 26.0 - 34.0 pg   MCHC 32.7 30.0 - 36.0 g/dL   RDW 40.9 (H) 81.1 - 91.4 %   Platelets 370 150 - 400 K/uL   nRBC 0.0 0.0 - 0.2 %  Comprehensive metabolic panel     Status: Abnormal   Collection Time: 12/21/21 11:46 AM  Result Value Ref Range   Sodium 138 135 - 145 mmol/L   Potassium 4.3 3.5 - 5.1 mmol/L   Chloride 104 98 - 111 mmol/L   CO2 24 22 - 32 mmol/L   Glucose, Bld 88 70 - 99 mg/dL   BUN 12 6 - 20 mg/dL   Creatinine, Ser 7.82 0.44 - 1.00 mg/dL   Calcium 9.1 8.9 - 95.6 mg/dL   Total Protein 6.5 6.5 - 8.1 g/dL   Albumin 3.0 (L) 3.5 - 5.0 g/dL   AST 34 15 - 41 U/L   ALT 32 0 - 44 U/L   Alkaline Phosphatase 90 38 - 126 U/L   Total Bilirubin 0.4 0.3 -  1.2 mg/dL   GFR, Estimated >21 >30 mL/min   Anion gap 10 5 - 15   MR BRAIN WO CONTRAST  Result Date: 12/21/2021 CLINICAL DATA:  New onset headache and blurred vision EXAM: MRI HEAD WITHOUT CONTRAST MRV HEAD WITHOUT CONTRAST TECHNIQUE: Multiplanar, multiecho pulse sequences of the brain and surrounding structures were obtained without intravenous contrast. Angiographic images of the intracranial venous structures were obtained using MRV technique without intravenous contrast. COMPARISON:  None. FINDINGS: MR HEAD FINDINGS Brain: No acute infarction, hemorrhage, hydrocephalus, extra-axial collection or mass lesion. Normal white matter.  Negative for demyelinating disease. Vascular: Normal arterial flow voids. Skull and upper cervical spine: No focal lesion. Sinuses/Orbits: Paranasal sinuses clear.  Negative orbit Other: None MRV HEAD FINDINGS Right transverse sinus dominant. Hypoplastic left transverse sinus. No stenosis or filling defect. Superior sagittal sinus patent. Straight sinus and internal cerebral veins patent. IMPRESSION: Negative MRI head Negative MR venogram of the head Electronically Signed   By: Marlan Palau M.D.   On: 12/21/2021 17:41   MR Venogram Head  Result Date: 12/21/2021 CLINICAL DATA:  New onset headache and blurred vision EXAM: MRI HEAD WITHOUT CONTRAST MRV HEAD WITHOUT CONTRAST TECHNIQUE: Multiplanar, multiecho pulse sequences of the brain and surrounding structures were obtained without intravenous contrast. Angiographic images of the intracranial venous structures were obtained using MRV technique without intravenous  contrast. COMPARISON:  None. FINDINGS: MR HEAD FINDINGS Brain: No acute infarction, hemorrhage, hydrocephalus, extra-axial collection or mass lesion. Normal white matter.  Negative for demyelinating disease. Vascular: Normal arterial flow voids. Skull and upper cervical spine: No focal lesion. Sinuses/Orbits: Paranasal sinuses clear.  Negative orbit Other: None MRV  HEAD FINDINGS Right transverse sinus dominant. Hypoplastic left transverse sinus. No stenosis or filling defect. Superior sagittal sinus patent. Straight sinus and internal cerebral veins patent. IMPRESSION: Negative MRI head Negative MR venogram of the head Electronically Signed   By: Marlan Palauharles  Clark M.D.   On: 12/21/2021 17:41   US MFM FETAL BPP WO NON STRESS  Result Date: 11/28/2021 ----------------------------------------------------------------------  OBSTETRICS REPORT                       (Signed Final 11/28/2021 02:40 pm) ---------------------------------------------------------------------- Patient Info  ID #:       [id number]                          D.O.B.:  07-Dec-1990 (30 yrs)  Name:       Filbert BertholdFABIOLA TOLEDO-SNIPES           Visit Date: 11/28/2021 08:23 am ---------------------------------------------------------------------- Performed By  Attending:        Ma RingsVictor Fang MD         Ref. Address:     [address]                                                             [address]                                                             [place], [place]                                                             [id number]  Performed By:     Hurman HornAlyssa Keatts          [facility]  Referred By:      Hermina StaggersMICHAEL L ERVIN                    MD ---------------------------------------------------------------------- Orders  #  Description                           Code        Ordered By  1  US MFM OB COMP + 14 WK  [address].60    Mariel Aloe  2  [place] MFM FETAL BPP WO NON               45409.81    Mariel Aloe     STRESS ----------------------------------------------------------------------  #  Order #                     Accession #                Episode #  1  [id number]                   [id number]                 [id number]  2  [id number]                   [id number]                 [id number]  ---------------------------------------------------------------------- Indications  Hypertension - Chronic with superimposed       O11.9 O10.919  preeclampsia  [redacted] weeks gestation of pregnancy                Z3A.34  Obesity complicating pregnancy                 O99.210 E66.9  Decreased fetal movement                       O36.8190 ---------------------------------------------------------------------- Fetal Evaluation  Num Of Fetuses:         1  Fetal Heart Rate(bpm):  138  Cardiac Activity:       Observed  Presentation:           Cephalic  Placenta:               Anterior  P. Cord Insertion:      Visualized  Amniotic Fluid  AFI FV:      Within normal limits  AFI Sum(cm)     %Tile       Largest Pocket(cm)  9.2             14          3   RUQ(cm)       RLQ(cm)       LUQ(cm)        LLQ(cm)  2.6           1.2           2.4            3 ---------------------------------------------------------------------- Biophysical Evaluation  Amniotic F.V:   Within normal limits       F. Tone:        Observed  F. Movement:    Not Observed               Score:          6/8  F. Breathing:   Observed ---------------------------------------------------------------------- Biometry  BPD:      86.3  mm     G. Age:  34w 6d         54  %    CI:        83.58   %    70 - 86  FL/HC:      21.7   %    20.1 - 22.3  HC:      297.6  mm     G. Age:  33w 0d        1.2  %    HC/AC:      1.03        0.93 - 1.11  AC:      288.8  mm     G. Age:  32w 6d         10  %    FL/BPD:     74.9   %    71 - 87  FL:       64.6  mm     G. Age:  33w 2d         12  %    FL/AC:      22.4   %    20 - 24  HUM:      56.5  mm     G. Age:  32w 6d         25  %  Est. FW:    2140  gm    4 lb 11 oz      11  % ---------------------------------------------------------------------- OB History  Gravidity:    4         Term:   1         SAB:   2 ---------------------------------------------------------------------- Gestational  Age  LMP:           34w 5d        Date:  03/30/21                 EDD:   01/04/22  U/S Today:     33w 4d                                        EDD:   01/12/22  Best:          34w 5d     Det. By:  LMP  (03/30/21)          EDD:   01/04/22 ---------------------------------------------------------------------- Anatomy  Cranium:               Appears normal         Diaphragm:              Appears normal  Face:                  Appears normal         Stomach:                Appears normal, left                         (orbits and profile)                           sided  Lips:                  Appears normal         Abdomen:                Appears normal  Thoracic:              Appears normal  Kidneys:                Appear normal  RVOT:                  Appears normal         Bladder:                Appears normal  LVOT:                  Appears normal         Spine:                  Ltd views no                                                                        intracranial signs of                                                                        NTD ---------------------------------------------------------------------- Comments  This patient has been admitted due to mild preeclampsia.  The overall EFW was 4 pounds 11 ounces (11th percentile).  There was normal amniotic fluid noted.  A biophysical profile performed today was 6 out of 8.  She  received a -2 for fetal breathing movements that did not meet  criteria.  She will have an NST performed. ----------------------------------------------------------------------                   Ma Rings, MD Electronically Signed Final Report   11/28/2021 02:40 pm ----------------------------------------------------------------------  [place] MFM OB COMP + 14 WK  Result Date: 11/28/2021 ----------------------------------------------------------------------  OBSTETRICS REPORT                       (Signed Final 11/28/2021 02:40 pm)  ---------------------------------------------------------------------- Patient Info  ID #:       [id number]                          D.O.B.:  18-Nov-1991 (30 yrs)  Name:       BAILY HOVANEC           Visit Date: 11/28/2021 08:23 am ---------------------------------------------------------------------- Performed By  Attending:        Ma Rings MD         Ref. Address:     [address]  [place], [place]                                                             [address]  Performed By:     Hurman Horn          [facility]  Referred By:      Hermina Staggers                    MD ---------------------------------------------------------------------- Orders  #  Description                           Code        Ordered By  1  [place] MFM OB COMP + 14 WK                76805.01    Mariel Aloe  2  [place] MFM FETAL BPP WO NON               76819.01    Mariel Aloe     STRESS ----------------------------------------------------------------------  #  Order #                     Accession #                Episode #  1  [id number]                   [id number]                 [id number]  2  [id number]                   [id number]                 [id number] ---------------------------------------------------------------------- Indications  Hypertension - Chronic with superimposed       O11.9 O10.919  preeclampsia  [redacted] weeks gestation of pregnancy                Z3A.34  Obesity complicating pregnancy                 O99.210 E66.9  Decreased fetal movement                       O36.8190 ---------------------------------------------------------------------- Fetal Evaluation  Num Of Fetuses:         1  Fetal Heart Rate(bpm):  138  Cardiac Activity:       Observed  Presentation:           Cephalic  Placenta:                Anterior  P. Cord Insertion:      Visualized  Amniotic Fluid  AFI FV:      Within normal limits  AFI Sum(cm)     %Tile  Largest Pocket(cm)  9.2             14          3   RUQ(cm)       RLQ(cm)       LUQ(cm)        LLQ(cm)  2.6           1.2           2.4            3 ---------------------------------------------------------------------- Biophysical Evaluation  Amniotic F.V:   Within normal limits       F. Tone:        Observed  F. Movement:    Not Observed               Score:          6/8  F. Breathing:   Observed ---------------------------------------------------------------------- Biometry  BPD:      86.3  mm     G. Age:  34w 6d         54  %    CI:        83.58   %    70 - 86                                                          FL/HC:      21.7   %    20.1 - 22.3  HC:      297.6  mm     G. Age:  33w 0d        1.2  %    HC/AC:      1.03        0.93 - 1.11  AC:      288.8  mm     G. Age:  32w 6d         10  %    FL/BPD:     74.9   %    71 - 87  FL:       64.6  mm     G. Age:  33w 2d         12  %    FL/AC:      22.4   %    20 - 24  HUM:      56.5  mm     G. Age:  32w 6d         25  %  Est. FW:    2140  gm    4 lb 11 oz      11  % ---------------------------------------------------------------------- OB History  Gravidity:    4         Term:   1         SAB:   2 ---------------------------------------------------------------------- Gestational Age  LMP:           34w 5d        Date:  03/30/21                 EDD:   01/04/22  U/S Today:     33w 4d  EDD:   01/12/22  Best:          34w 5d     Det. By:  LMP  (03/30/21)          EDD:   01/04/22 ---------------------------------------------------------------------- Anatomy  Cranium:               Appears normal         Diaphragm:              Appears normal  Face:                  Appears normal         Stomach:                Appears normal, left                         (orbits and profile)                           sided   Lips:                  Appears normal         Abdomen:                Appears normal  Thoracic:              Appears normal         Kidneys:                Appear normal  RVOT:                  Appears normal         Bladder:                Appears normal  LVOT:                  Appears normal         Spine:                  Ltd views no                                                                        intracranial signs of                                                                        NTD ---------------------------------------------------------------------- Comments  This patient has been admitted due to mild preeclampsia.  The overall EFW was 4 pounds 11 ounces (11th percentile).  There was normal amniotic fluid noted.  A biophysical profile performed today was 6 out of 8.  She  received a -2 for fetal breathing movements that did not meet  criteria.  She will have an NST performed. ----------------------------------------------------------------------  Ma Rings, MD Electronically Signed Final Report   11/28/2021 02:40 pm ----------------------------------------------------------------------  US FETAL BPP W/NONSTRESS  Result Date: 12/13/2021 ----------------------------------------------------------------------  OBSTETRICS REPORT                        (Signed Final 12/13/2021 05:26 pm) ---------------------------------------------------------------------- Patient Info  ID #:       [id number]                          D.O.B.:  September 06, 1991 (30 yrs)  Name:       Filbert Berthold           Visit Date: 12/06/2021 01:24 pm ---------------------------------------------------------------------- Performed By  Attending:        Nettie Elm MD       Ref. Address:      [address]                                                              [place], [place]                                                               [address]  Performed By:     Marchelle Folks [place]NC          [facility]                                                              [facility]  Referred By:      Hermina Staggers                    MD ---------------------------------------------------------------------- Orders  #  Description                           Code        Ordered By  1  US FETAL BPP W/NONSTRESS              76195.0     Nettie Elm ----------------------------------------------------------------------  #  Order #                     Accession #                Episode #  1  [id number]                   [id number]                 [id number] ---------------------------------------------------------------------- Service(s) Provided  US Fetal BPP W NST                                    [phone] ---------------------------------------------------------------------- Indications  [redacted] weeks gestation of pregnancy                 Z3A.35  Pre-existing hypertension with preeclampsia,    O11.3  third trimester ---------------------------------------------------------------------- Fetal Evaluation  Num Of Fetuses:          1  Preg. Location:          Intrauterine  Cardiac Activity:        Observed  Presentation:            Cephalic  AFI Sum(cm)     %Tile       Largest Pocket(cm)  10.2            23          4.7  RUQ(cm)       RLQ(cm)       LUQ(cm)        LLQ(cm)  1.89          2.22          4.7            1.39 ---------------------------------------------------------------------- Biophysical Evaluation  Amniotic F.V:   Pocket => 2 cm             F. Tone:         Observed  F. Movement:    Observed                   N.S.T:           Reactive  F. Breathing:   Observed                   Score:           10/10  ---------------------------------------------------------------------- OB History  Gravidity:    4         Term:   1         SAB:   2 ---------------------------------------------------------------------- Gestational Age  LMP:           35w 6d        Date:  03/30/21  EDD:   01/04/22  Best:          35w 6d     Det. By:  LMP  (03/30/21)          EDD:   01/04/22 ---------------------------------------------------------------------- Impression  BPP 10/10  Vertex ---------------------------------------------------------------------- Recommendations  Continue with antenatal testing as clinically indicated ----------------------------------------------------------------------                  Nettie Elm, MD Electronically Signed Final Report   12/13/2021 05:26 pm ----------------------------------------------------------------------   MAU Course  Procedures  MDM -no concern for pre-E with normal BP and normal labs -pt on Procardia daily and received magnesium during her delivery -CBC: no abnormalities requiring treatment -CMP: WNL  -new onset severe HA with blurry vision relieved by Tylenol, but slowly -no hx migraines -MRI/Venogram: negative, pt reports she was not given contrast dye -Tylenol/Reglan given in MAU, pt reports HA now 3/10, down from 7/10 -consulted with on-call neurology who recommends follow-up with neurology outpatient - Dr. Ocie Doyne at [facility] who is also a HA specialist. Dr. Derry Lory agrees with plan to discharge patient to home at this time  -constipation, without any bowel movement since last Thursday -relieved with soap suds enema in MAU  -pt discharged to home in stable condition  Orders Placed This Encounter  Procedures   MR BRAIN WO CONTRAST    Standing Status:   Standing    Number of Occurrences:   1    Order Specific Question:   What is the patient's sedation requirement?    Answer:   No Sedation    Order Specific Question:   Does  the patient have a pacemaker or implanted devices?    Answer:   No   MR Venogram Head    Please use contrast for MR Venogram    Standing Status:   Standing    Number of Occurrences:   1    Order Specific Question:   What is the patient's sedation requirement?    Answer:   No Sedation    Order Specific Question:   Does the patient have a pacemaker or implanted devices?    Answer:   No   CBC    Standing Status:   Standing    Number of Occurrences:   1   Comprehensive metabolic panel    Standing Status:   Standing    Number of Occurrences:   1   Ambulatory referral to Neurology    Referral Priority:   Routine    Referral Type:   Consultation    Referral Reason:   Specialty Services Required    Requested Specialty:   Neurology    Number of Visits Requested:   1   Soap suds enema    Standing Status:   Standing    Number of Occurrences:   1   Discharge patient    Order Specific Question:   Discharge disposition    Answer:   01-Home or Self Care [1]    Order Specific Question:   Discharge patient date    Answer:   12/21/2021   Meds ordered this encounter  Medications   acetaminophen (TYLENOL) tablet 1,000 mg   metoCLOPramide (REGLAN) tablet 10 mg   metoCLOPramide (REGLAN) 10 MG tablet    Sig: Take 1 tablet (10 mg total) by mouth 3 (three) times daily with meals.    Dispense:  90 tablet    Refill:  1    Order Specific Question:  Supervising Provider    Answer:   Federico FlakeEWTON, KIMBERLY NILES [[id number]]   Assessment and Plan   1. Headache in pregnancy, postpartum   2. Blurred vision   3. Constipation, unspecified constipation type     Allergies as of 12/21/2021   No Known Allergies      Medication List     TAKE these medications    acetaminophen 500 MG tablet Commonly known as: TYLENOL Take 500 mg by mouth every 6 (six) hours as needed.   ibuprofen 600 MG tablet Commonly known as: ADVIL Take 1 tablet (600 mg total) by mouth every 6 (six) hours.   metoCLOPramide 10 MG  tablet Commonly known as: REGLAN Take 1 tablet (10 mg total) by mouth 3 (three) times daily with meals.   NIFEdipine 30 MG 24 hr tablet Commonly known as: ADALAT CC Take 1 tablet (30 mg total) by mouth daily.   prenatal multivitamin Tabs tablet Take 1 tablet by mouth daily at 12 noon.       -RX Reglan -constipation protocol given to maintain bowel regimen -[facility] -return MAU precautions given -pt discharged to home in stable condition  Odie Seraicole E Mariha Sleeper 12/21/2021, 6:33 PM

## 2021-12-21 NOTE — Progress Notes (Signed)
Pt reports had good results from enema, had good BM.

## 2021-12-21 NOTE — Progress Notes (Addendum)
Subjective:  Madison Hood is a 32 y.o. female here for BP check.   Hypertension ROS: c/o headaches 7/10, blurry vision, floaters, swollen ankles and feet. She is taking medications as instructed, no medication side effects noted, no TIA's, no chest pain on exertion, no dyspnea on exertion, no swelling of ankles, noting swelling of ankles, and feet .    Objective:  BP 130/87 (BP Location: Right Arm, Cuff Size: Normal)    Pulse 89    Wt 172 lb (78 kg)    LMP 03/30/2021 Comment: spotted one day   Breastfeeding Yes    BMI 26.94 kg/m   Appearance alert, well appearing, and in no distress and anxious. General exam BP noted to be well controlled today in office.    Assessment:   Blood Pressure stable.   Plan: Per Dr. Alysia Penna, Patient was sent to MAU for monitoring. MAU was contacted via telephone and notified of patients imminent arrival.

## 2021-12-21 NOTE — Progress Notes (Signed)
SS enema done, pt tolerated 1000cc fliud.  Instructed to hold fluid as long as possible for best results.  Also instructed to notify RN when has BM.  Pt verbalized understanding & agreeable.

## 2021-12-21 NOTE — Progress Notes (Signed)
Agree with nurses's documentation of this patient's clinic encounter.  Adreona Brand L, MD  

## 2021-12-29 ENCOUNTER — Telehealth (HOSPITAL_COMMUNITY): Payer: Self-pay

## 2021-12-29 NOTE — Telephone Encounter (Signed)
No answer. Left message to return nurse call.  Sharyn Lull [facility] 12/29/2021,1011

## 2022-01-23 ENCOUNTER — Ambulatory Visit (INDEPENDENT_AMBULATORY_CARE_PROVIDER_SITE_OTHER): Payer: Self-pay | Admitting: Advanced Practice Midwife

## 2022-01-23 ENCOUNTER — Other Ambulatory Visit: Payer: Self-pay

## 2022-01-23 VITALS — Wt 168.0 lb

## 2022-01-23 DIAGNOSIS — M79671 Pain in right foot: Secondary | ICD-10-CM

## 2022-01-23 DIAGNOSIS — K5900 Constipation, unspecified: Secondary | ICD-10-CM

## 2022-01-23 DIAGNOSIS — O99345 Other mental disorders complicating the puerperium: Secondary | ICD-10-CM

## 2022-01-23 DIAGNOSIS — Z3009 Encounter for other general counseling and advice on contraception: Secondary | ICD-10-CM

## 2022-01-23 DIAGNOSIS — R87618 Other abnormal cytological findings on specimens from cervix uteri: Secondary | ICD-10-CM

## 2022-01-23 DIAGNOSIS — Z8759 Personal history of other complications of pregnancy, childbirth and the puerperium: Secondary | ICD-10-CM | POA: Insufficient documentation

## 2022-01-23 DIAGNOSIS — O9089 Other complications of the puerperium, not elsewhere classified: Secondary | ICD-10-CM

## 2022-01-23 DIAGNOSIS — F418 Other specified anxiety disorders: Secondary | ICD-10-CM | POA: Insufficient documentation

## 2022-01-23 DIAGNOSIS — R52 Pain, unspecified: Secondary | ICD-10-CM

## 2022-01-23 DIAGNOSIS — M79672 Pain in left foot: Secondary | ICD-10-CM

## 2022-01-23 DIAGNOSIS — H538 Other visual disturbances: Secondary | ICD-10-CM

## 2022-01-23 MED ORDER — SLYND 4 MG PO TABS: 1.0000 | ORAL_TABLET | Freq: Every day | ORAL | 2 refills | Status: DC

## 2022-01-23 NOTE — Progress Notes (Addendum)
..   Post Partum Visit Note  Madison Hood is a 31 y.o. [phone] female who presents for a postpartum visit. She is 5 weeks postpartum following a normal spontaneous vaginal delivery.  I have fully reviewed the prenatal and intrapartum course. The delivery was at 37.0 gestational weeks.  Anesthesia: epidural. Postpartum course has been good. Baby is doing well. Baby is feeding by breast. Bleeding staining only. Bowel function is abnormal: constipated . Bladder function is normal. Patient is not sexually active. Contraception method is none. Postpartum depression screening: negative.   The pregnancy intention screening data noted above was reviewed. Potential methods of contraception were discussed. The patient elected to proceed with No data recorded.   Edinburgh Postnatal Depression Scale - 01/23/22 1342       Edinburgh Postnatal Depression Scale:  In the Past 7 Days   I have been able to laugh and see the funny side of things. 0    I have looked forward with enjoyment to things. 0    I have blamed myself unnecessarily when things went wrong. 0    I have been anxious or worried for no good reason. 1    I have felt scared or panicky for no good reason. 0    Things have been getting on top of me. 1    I have been so unhappy that I have had difficulty sleeping. 0    I have felt sad or miserable. 1    I have been so unhappy that I have been crying. 0    The thought of harming myself has occurred to me. 0    Edinburgh Postnatal Depression Scale Total 3             Health Maintenance Due  Topic Date Due   INFLUENZA VACCINE  07/02/2021   PAP SMEAR-Modifier  09/06/2021    The following portions of the patient's history were reviewed and updated as appropriate: allergies, current medications, past family history, past medical history, past social history, past surgical history, and problem list.  Review of Systems Pertinent items noted in HPI and remainder of comprehensive ROS  otherwise negative.  Objective:  Wt 168 lb (76.2 kg)    BMI 26.31 kg/m    VS reviewed, nursing note reviewed,  Constitutional: well developed, well nourished, no distress HEENT: normocephalic CV: normal rate Pulm/chest wall: normal effort Abdomen: soft Neuro: alert and oriented x 3 Skin: warm, dry Psych: affect normal   Assessment:   1. Postpartum anxiety --Pt with hx depression, reports she thinks a lot of her anxious feelings are due to lack of sleep.  She reports feeling overwhelmed at times with her 26 year old and the baby. She denies thoughts of harming herself or others. --Discussed options and pt would like to talk with IBH - Ambulatory referral to Integrated Behavioral [facility]  --F/U in office in 4 weeks  2. Postpartum care following vaginal delivery --Bonding well with baby, may be doing too much housework, feeling cramping pain at times.  Discussed asking for help, using her support system and/or doing less housework for now.  3. Foot pain, bilateral --Pain in both heels.  ? Related to pt significant edema in pregnancy that has now resolved?  Pt wearing supportive shoes. F/U in 4 weeks, refer if pain still present.  4. Postpartum pain --Pain in lower abdomen, sharp bilateral pain. Worse after doing housework or being more active.  --Rest/ice/heat/warm bath/increase PO fluids/Tylenol   5. Blurred vision --Pt continues to  report occasional episodes of blurred vision.  BP is reported as 110s/70s in office today, will follow up with RN for documentation. Pt taking home BP 1-2 times weekly and reports BP 120s/70s or lower.   --She stopped taking BP meds 2 weeks postpartum and BP is grossly normal.   --This is unlikely related to HTN. Follow up with optometry and/or PCP.  6.  Constipation --Miralax and diet changes not improving symptoms, but taking Dulcolax 1x/week which helps.  --Discussed dietary changes, add fiber via food or fiber supplements.  Try Miralax for daily  use and Dulcolax infrequently.   --F/U in 4 weeks  7.  Abnormal Pap --Needs repeat, will do in 4 weeks at follow up  Plan:   Essential components of care per ACOG recommendations:  1.  Mood and well being: Patient with negative depression screening today. Reviewed local resources for support.  - Patient tobacco use? No.   - hx of drug use? No.    2. Infant care and feeding:  -Patient currently breastmilk feeding? Yes. Reviewed importance of draining breast regularly to support lactation.  -Social determinants of health (SDOH) reviewed in EPIC. No concerns   3. Sexuality, contraception and birth spacing - Patient does not want a pregnancy in the next year.   --Discussed pt contraceptive plans and reviewed contraceptive methods based on pt preferences and effectiveness.  Pt prefers  POPs x 1 month, f/u in office for LARC, either IUD or Nexplanon   - Discussed birth spacing of 18 months  4. Sleep and fatigue -Encouraged family/partner/community support of 4 hrs of uninterrupted sleep to help with mood and fatigue  5. Physical Recovery  - Discussed patients delivery and complications. She describes her labor as mixed. - Patient had a Vaginal problems after delivery including retained placenta and postpartum hemorrhage . Patient had no  laceration. Perineal healing reviewed. Patient expressed understanding - Patient has urinary incontinence? No. - Patient is safe to resume physical and sexual activity  6.  Health Maintenance - HM due items addressed Yes - Last pap smear  Diagnosis  Date Value Ref Range Status  10/06/2017   Final   NEGATIVE FOR INTRAEPITHELIAL LESIONS OR MALIGNANCY.   Pap smear not done at today's visit.  -Breast Cancer screening indicated? No.   7. Chronic Disease/Pregnancy Condition follow up: Hypertension  - PCP follow up  Sharen Counter, CNM [facility] [employer]

## 2022-01-25 ENCOUNTER — Encounter: Payer: Self-pay | Admitting: Licensed Clinical Social Worker

## 2022-01-29 ENCOUNTER — Ambulatory Visit: Payer: Self-pay | Admitting: Psychiatry

## 2022-01-29 ENCOUNTER — Encounter: Payer: Self-pay | Admitting: Psychiatry
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
## 2022-02-18 ENCOUNTER — Other Ambulatory Visit: Payer: Self-pay

## 2022-02-18 ENCOUNTER — Ambulatory Visit (INDEPENDENT_AMBULATORY_CARE_PROVIDER_SITE_OTHER): Payer: Self-pay | Admitting: Advanced Practice Midwife

## 2022-02-18 ENCOUNTER — Encounter: Payer: Self-pay | Admitting: Advanced Practice Midwife

## 2022-02-18 ENCOUNTER — Other Ambulatory Visit (HOSPITAL_COMMUNITY)
Admission: RE | Admit: 2022-02-18 | Discharge: 2022-02-18 | Disposition: A | Discharge status: Home / Self Care | Payer: Self-pay | Source: Ambulatory Visit | Attending: Advanced Practice Midwife | Admitting: Advanced Practice Midwife

## 2022-02-18 VITALS — BP 113/74 | HR 69 | Ht 67.0 in | Wt 171.0 lb

## 2022-02-18 DIAGNOSIS — F418 Other specified anxiety disorders: Secondary | ICD-10-CM

## 2022-02-18 DIAGNOSIS — R87810 Cervical high risk human papillomavirus (HPV) DNA test positive: Secondary | ICD-10-CM

## 2022-02-18 DIAGNOSIS — Z3009 Encounter for other general counseling and advice on contraception: Secondary | ICD-10-CM

## 2022-02-18 DIAGNOSIS — R87618 Other abnormal cytological findings on specimens from cervix uteri: Secondary | ICD-10-CM

## 2022-02-18 DIAGNOSIS — O99345 Other mental disorders complicating the puerperium: Secondary | ICD-10-CM

## 2022-02-18 NOTE — Progress Notes (Addendum)
31 y.o GYN presents for [facility], she wants the Nexplanon. She is currently taking OCP. ? 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 ? ? ?

## 2022-02-18 NOTE — Progress Notes (Signed)
? ?  GYNECOLOGY PROGRESS NOTE ? ?History:  ?31 y.o. [id number] presents to [facility] office today for contraceptive visit and mood check.  She is taking Slynd daily and plans to get a Nexplanon.  She reports some irritability continues, and she is not ready to resume intercourse with her husband and worries that something is wrong with her interest right now, but overall mood is slightly improved.   She denies h/a, dizziness, shortness of breath, n/v, or fever/chills.   ? ?The following portions of the patient's history were reviewed and updated as appropriate: allergies, current medications, past family history, past medical history, past social history, past surgical history and problem list. Pap 202 ? ?Health Maintenance Due  ?Topic Date Due  ? INFLUENZA VACCINE  07/02/2021  ? PAP SMEAR-Modifier  09/06/2021  ?  ? ?Review of Systems:  ?Pertinent items are noted in HPI. ?  ?Objective:  ?Physical Exam ?Blood pressure 113/74, pulse 69, height 5\' 7"  (1.702 m), weight 171 lb (77.6 kg), last menstrual period 02/05/2022, currently breastfeeding. ?VS reviewed, nursing note reviewed,  ?Constitutional: well developed, well nourished, no distress ?HEENT: normocephalic ?CV: normal rate ?Pulm/chest wall: normal effort ?Breast Exam: deferred ?Abdomen: soft ?Neuro: alert and oriented x 3 ?Skin: warm, dry ?Psych: affect normal ?Pelvic exam: Deferred ? ?Assessment & Plan:  ?1. Cervical high risk HPV (human papillomavirus) test positive ?--Discussed Pap costs today and pt to have Pap in our office, not [facility]. Will follow up for Nexplanon there, see below. ?- Cytology - PAP( [place]) ? ?2. Postpartum anxiety ?--Improving, starting to exercise, just feels more irritable than she should ?--Declines IBH and meds today ?--F/U mood check with me in 2 months ? ?3. Encounter for counseling regarding contraception ?--Pt taking Slynd to bridge to Nexplanon ?--Pt adopt-a-mom so [facility]on less [facility] at [facility]. Discussed with pt and referral to  HD for Nexplanon insertion. ?--Sample of Slynd x 1 pack given from our office today, pt to contact if she needs additional samples.  ? ? ?[facility], CNM ?10:36 AM  ?

## 2022-02-19 LAB — CYTOLOGY - PAP
Comment: NEGATIVE
Diagnosis: NEGATIVE
High risk HPV: NEGATIVE

## 2022-02-27 ENCOUNTER — Encounter: Payer: Self-pay | Admitting: Family Medicine

## 2022-02-27 ENCOUNTER — Ambulatory Visit (LOCAL_COMMUNITY_HEALTH_CENTER): Payer: Self-pay | Admitting: Family Medicine

## 2022-02-27 ENCOUNTER — Other Ambulatory Visit: Payer: Self-pay

## 2022-02-27 ENCOUNTER — Ambulatory Visit: Payer: Self-pay

## 2022-02-27 VITALS — BP 107/67 | Ht 67.0 in | Wt 171.6 lb

## 2022-02-27 DIAGNOSIS — Z3009 Encounter for other general counseling and advice on contraception: Secondary | ICD-10-CM

## 2022-02-27 DIAGNOSIS — Z0000 Encounter for general adult medical examination without abnormal findings: Secondary | ICD-10-CM

## 2022-02-27 DIAGNOSIS — Z30017 Encounter for initial prescription of implantable subdermal contraceptive: Secondary | ICD-10-CM

## 2022-02-27 MED ORDER — ETONOGESTREL 68 MG ~~LOC~~ IMPL
68.0000 mg | DRUG_IMPLANT | Freq: Once | SUBCUTANEOUS | Status: AC
  Administered 2022-02-27: 68 mg via SUBCUTANEOUS

## 2022-02-27 NOTE — Progress Notes (Signed)
Patient here to get Nexplanon inserted. Consent forms signed. Condoms declined. Patient declined. Nexplanon was a TANF (yellow sticker) and charted in dispensing log.  ?

## 2022-02-27 NOTE — Progress Notes (Signed)
[facility] DEPARTMENT ?Family Planning Clinic ?[address] [employer] ?Main Number: [phone] ? ?Family Planning Visit- Repeat Yearly Visit ? ?Subjective:  ?Madison Hood is a 31 y.o. [phone]  being seen today for an annual wellness visit and to discuss contraception options.   The patient is currently using Oral Contraceptive for pregnancy prevention. Patient does not want a pregnancy in the next year.  ? ?she/her/hers report they are looking for a method that provides Discrete method, Long term method, and Methods that does not involve too much memory ? ? ?Patient has the following medical problems: has Major depressive disorder, single episode, mild (HCC); Anxiety; Abnormal Pap smear of cervix  09/06/20 neg HPV +; Constipation; History of gestational hypertension; Blurred vision; and Postpartum anxiety on their problem list. ? ?Chief Complaint  ?Patient presents with  ? Contraception  ? ? ?Patient reports here for nexplanon, pt was sent from [facility] office to [facility] for device placement but [facility] did not have appointment available.   ? ?Patient denies any other concerns.    ? ?See flowsheet for other program required questions.  ? ?Body mass index is 26.88 kg/m?. - Patient is eligible for diabetes screening based on BMI and age >24?  not applicable ?HA1C ordered? not applicable ? ?Patient reports 1 of partners in last year. Desires STI screening?  No - declined  ? ? ?Has patient been screened once for HCV in the past?  Yes ?  ?Lab Results  ?Component Value Date  ? HCVAB Negative 06/13/2021  ? ? ?Does the patient have current of drug use, have a partner with drug use, and/or has been incarcerated since last result? No  ?If yes-- Screen for HCV through [facility] ?  ?Does the patient meet criteria for HBV testing? Yes ? ?Criteria:  ?-Household, sexual or needle sharing contact with HBV ?-History of drug use ?-HIV positive ?-Those with known Hep C ? ? ?Health Maintenance Due  ?Topic Date Due  ?  INFLUENZA VACCINE  07/02/2021  ? ? ?Review of Systems  ?Constitutional:  Negative for chills, fever, malaise/fatigue and weight loss.  ?HENT:  Negative for congestion, hearing loss and sore throat.   ?Eyes:  Negative for blurred vision, double vision and photophobia.  ?Respiratory:  Negative for shortness of breath.   ?Cardiovascular:  Negative for chest pain.  ?Gastrointestinal:  Negative for abdominal pain, blood in stool, constipation, diarrhea, heartburn, nausea and vomiting.  ?Genitourinary:  Negative for dysuria and frequency.  ?Musculoskeletal:  Negative for back pain, joint pain and neck pain.  ?Skin:  Negative for itching and rash.  ?Neurological:  Negative for dizziness, weakness and headaches.  ?Endo/Heme/Allergies:  Does not bruise/bleed easily.  ?Psychiatric/Behavioral:  Negative for depression, substance abuse and suicidal ideas.   ? ?The following portions of the patient's history were reviewed and updated as appropriate: allergies, current medications, past family history, past medical history, past social history, past surgical history and problem list. Problem list updated. ? ?Objective:  ? ?Vitals:  ? 02/27/22 0859  ?BP: 107/67  ?Weight: 171 lb 9.6 oz (77.8 kg)  ?Height: 5\' 7"  (1.702 m)  ? ? ?Physical Exam ?Vitals and nursing note reviewed.  ?Constitutional:   ?   Appearance: Normal appearance.  ?HENT:  ?   Head: Normocephalic and atraumatic.  ?   Mouth/Throat:  ?   Mouth: Mucous membranes are moist.  ?   Dentition: Normal dentition. No dental caries.  ?   Pharynx: No oropharyngeal exudate or posterior  oropharyngeal erythema.  ?Eyes:  ?   General: No scleral icterus. ?Neck:  ?   Thyroid: No thyroid mass, thyromegaly or thyroid tenderness.  ?Cardiovascular:  ?   Rate and Rhythm: Normal rate and regular rhythm.  ?   Pulses: Normal pulses.  ?   Heart sounds: Normal heart sounds.  ?Pulmonary:  ?   Effort: Pulmonary effort is normal.  ?   Breath sounds: Normal breath sounds.  ?Abdominal:  ?   General:  Abdomen is flat. Bowel sounds are normal.  ?   Palpations: Abdomen is soft.  ?Genitourinary: ?   Comments: Deferred  ?Musculoskeletal:     ?   General: Normal range of motion.  ?Skin: ?   General: Skin is warm and dry.  ?Neurological:  ?   General: No focal deficit present.  ?   Mental Status: She is alert and oriented to person, place, and time.  ?Psychiatric:     ?   Mood and Affect: Mood normal.     ?   Behavior: Behavior normal.  ? ? ? ? ?Assessment and Plan:  ?Madison Hood is a 31 y.o. female [phone] presenting to the [facility] Department for an yearly wellness and contraception visit ? ? ?1. Routine general medical examination at a health care facility ?Well woman exam  ?PAP due 2024 ?CBE today  ?Declined SI testing  ? ?2. Family planning counseling ?Contraception counseling: Reviewed options based on patient desire and reproductive life plan. Patient is interested in Hormonal Implant. This was provided to the patient today. Pt has been taking Slynd since PP visit 01/23/2022.   ? ?Risks, benefits, and typical effectiveness rates were reviewed.  Questions were answered.  Written information was also given to the patient to review.   ? ?The patient will follow up in  as needed for surveillance.  The patient was told to call with any further questions, or with any concerns about this method of contraception.  Emphasized use of condoms 100% of the time for STI prevention. ? ?Patient was assessed for need for ECP. Patient was offered ECP based on > 120 hours .  Patient is within 10 days of unprotected sex. Patient was offered ECP. Reviewed options and patient desired No method of ECP, declined all   ? ? ?3.  Encounter for initial prescription of implantable subdermal contraceptive ? ?Nexplanon Insertion Procedure ?Patient identified, informed consent performed, consent signed.   Patient does understand that irregular bleeding is a very common side effect of this medication. She was advised to  have backup contraception after placement. Patient was determined to meet WHO criteria for not being pregnant. Appropriate time out taken.  The insertion site was identified 8-10 cm (3-4 inches) from the medial epicondyle of the humerus and 3-5 cm (1.25-2 inches) posterior to (below) the sulcus (groove) between the biceps and triceps muscles of the patient's left  arm and marked.  Patient was prepped with alcohol swab and then injected with 3 ml of 1% lidocaine.  Arm was prepped with chlorhexidene, Nexplanon removed from packaging,  Device confirmed in needle, then inserted full length of needle and withdrawn per handbook instructions. Nexplanon was able to palpated in the patient's arm; patient palpated the insert herself. There was minimal blood loss.  Patient insertion site covered with guaze and a pressure bandage to reduce any bruising.  The patient tolerated the procedure well and was given post procedure instructions.   ? ?Counseled patient to take OTC analgesic starting as  soon as lidocaine starts to wear off and take regularly for at least 48 hr to decrease discomfort.  Specifically to take with food or milk to decrease stomach upset and for IB 600 mg (3 tablets) every 6 hrs; IB 800 mg (4 tablets) every 8 hrs; or Aleve 2 tablets every 12 hrs. ?  ? ?- etonogestrel (NEXPLANON) implant 68 mg ? ? ? ? ?No follow-ups on file. ? ?Future Appointments  ?Date Time Provider [place]  ?04/23/2022  9:35 AM Leftwich-Kirby, Kathie Dike, CNM [facility] None  ? ? ?Junious Dresser, FNP ? ?

## 2022-04-23 ENCOUNTER — Ambulatory Visit: Payer: Self-pay | Admitting: Advanced Practice Midwife

## 2022-04-23 NOTE — Progress Notes (Unsigned)
   GYNECOLOGY PROGRESS NOTE  History:  31 y.o. [id number] presents to [facility]  office today for mood check visit. She was seen for postpartum anxiety mood check 02/18/22 and reported improvement with exercise and self-care. She was not prescribed medications at that time.    She reports *****.  She denies h/a, dizziness, shortness of breath, n/v, or fever/chills.    The following portions of the patient's history were reviewed and updated as appropriate: allergies, current medications, past family history, past medical history, past social history, past surgical history and problem list. Last pap smear on *** was normal, *** HRHPV.  There are no preventive care reminders to display for this patient.   Review of Systems:  Pertinent items are noted in HPI.   Objective:  Physical Exam currently breastfeeding. VS reviewed, nursing note reviewed,  Constitutional: well developed, well nourished, no distress HEENT: normocephalic CV: normal rate Pulm/chest wall: normal effort Breast Exam: deferred Abdomen: soft Neuro: alert and oriented x 3 Skin: warm, dry Psych: affect normal Pelvic exam: Cervix pink, visually closed, without lesion, scant white creamy discharge, vaginal walls and external genitalia normal Bimanual exam: Cervix 0/long/high, firm, anterior, neg CMT, uterus nontender, nonenlarged, adnexa without tenderness, enlargement, or mass  Assessment & Plan:  There are no diagnoses linked to this encounter.  No follow-ups on file.   Sharen Counter, CNM 8:23 AM

## 2023-09-24 ENCOUNTER — Ambulatory Visit: Payer: Self-pay

## 2023-10-27 ENCOUNTER — Ambulatory Visit: Payer: Self-pay

## 2023-11-10 ENCOUNTER — Ambulatory Visit (LOCAL_COMMUNITY_HEALTH_CENTER): Payer: Self-pay

## 2023-11-10 DIAGNOSIS — Z23 Encounter for immunization: Secondary | ICD-10-CM

## 2023-11-10 DIAGNOSIS — Z719 Counseling, unspecified: Secondary | ICD-10-CM

## 2023-11-10 NOTE — Progress Notes (Signed)
In nurse clinic for flu and covid vaccines today.   Fluzone and [employer] [phone] (12 yrs+) given and tolerated well. Patient paid for flu today and Covid vaccine (state supply) given at no cost to patient.  Stayed for 15 min observation without problem. Jerel Shepherd, RN

## 2023-11-18 ENCOUNTER — Ambulatory Visit: Payer: Self-pay

## 2023-11-18 ENCOUNTER — Encounter: Payer: Self-pay | Admitting: Family Medicine

## 2023-11-18 ENCOUNTER — Ambulatory Visit: Payer: Self-pay | Admitting: Nurse Practitioner

## 2023-11-18 VITALS — BP 97/66 | Wt 187.8 lb

## 2023-11-18 DIAGNOSIS — Z113 Encounter for screening for infections with a predominantly sexual mode of transmission: Secondary | ICD-10-CM

## 2023-11-18 DIAGNOSIS — Z3009 Encounter for other general counseling and advice on contraception: Secondary | ICD-10-CM

## 2023-11-18 DIAGNOSIS — Z719 Counseling, unspecified: Secondary | ICD-10-CM

## 2023-11-18 DIAGNOSIS — Z01419 Encounter for gynecological examination (general) (routine) without abnormal findings: Secondary | ICD-10-CM

## 2023-11-18 DIAGNOSIS — Z8619 Personal history of other infectious and parasitic diseases: Secondary | ICD-10-CM

## 2023-11-18 LAB — HM HIV SCREENING LAB: HM HIV Screening: NEGATIVE

## 2023-11-18 LAB — WET PREP FOR TRICH, YEAST, CLUE
Trichomonas Exam: NEGATIVE
Yeast Exam: NEGATIVE

## 2023-11-18 NOTE — Progress Notes (Signed)
Patient seen in nurse clinic for vaccinations.  Patient does not have insurance - discussed patient assistance program and patient will provide documentation for income.  1040 received in the afternoon and Merck applications for Hep B, MMR and Varicella processed.  Approval for all 3 vaccines by [employer].  Patient contacted and scheduled for immunization appointment for 4 PM on 11/19/2023.  Discussed with patient that when 3rd dose of Hep B needed and patient wants to apply for patient assistance - another application will be needed.

## 2023-11-18 NOTE — Progress Notes (Cosign Needed)
[facility] DEPARTMENT [facility] [address] Main Number: [phone]  Family Planning Visit- Repeat Yearly Visit  Subjective:  Madison Hood is a 32 y.o. [id number]  being seen today for an annual wellness visit and to discuss contraception options.   The patient is currently using Hormonal Implant for pregnancy prevention. Patient does not want a pregnancy in the next year.    Patient has the following medical problems: has Major depressive disorder, single episode, mild (HCC); Anxiety; Abnormal Pap smear of cervix  09/06/20 neg HPV +; Constipation; History of gestational hypertension; Blurred vision; and Postpartum anxiety on their problem list.  Chief Complaint  Patient presents with   Annual Exam    Pt is here for PE and Nexplanon check.    Patient reports small circular bruises that appeared around her nexplanon last month with no reported trauma to the area.   See flowsheet for other program required questions.   Body mass index is 29.41 kg/m. - Patient is eligible for diabetes screening based on BMI> 25 and age >35?  no HA1C ordered? no  Patient reports 1 of partners in last year. Desires STI screening?  Yes   Has patient been screened once for HCV in the past?  No  Lab Results  Component Value Date   HCVAB Negative 06/13/2021    Does the patient have current of drug use, have a partner with drug use, and/or has been incarcerated since last result? No  If yes-- Screen for HCV through [facility]   Does the patient meet criteria for HBV testing? No  Criteria:  -Household, sexual or needle sharing contact with HBV -History of drug use -HIV positive -Those with known Hep C   Health Maintenance Due  Topic Date Due   Cervical Cancer Screening (Pap smear)  02/19/2023    ROS  The following portions of the patient's history were reviewed and updated as appropriate: allergies, current medications, past family history,  past medical history, past social history, past surgical history and problem list. Problem list updated.  Objective:   Vitals:   11/18/23 1046  BP: 97/66  Weight: 187 lb 12.8 oz (85.2 kg)    Physical Exam Vitals and nursing note reviewed. Exam conducted with a chaperone present [facility]).  Constitutional:      Appearance: Normal appearance.  HENT:     Head: Normocephalic and atraumatic.     Mouth/Throat:     Mouth: Mucous membranes are moist.     Pharynx: Oropharynx is clear. No oropharyngeal exudate or posterior oropharyngeal erythema.  Pulmonary:     Effort: Pulmonary effort is normal.  Chest:  Breasts:    Right: Normal.     Left: Normal.       Comments: 0.5 cm raised, slightly irregularly shaped, brown/black mole Abdominal:     General: Abdomen is flat.     Palpations: There is no mass.     Tenderness: There is no abdominal tenderness. There is no rebound.  Genitourinary:    General: Normal vulva.     Exam position: Lithotomy position.     Pubic Area: No rash or pubic lice.      Labia:        Right: No rash or lesion.        Left: No rash or lesion.      Vagina: Vaginal discharge (white creamy) present. No erythema, bleeding or lesions.     Cervix: No cervical motion tenderness, discharge, friability,  lesion or erythema.     Uterus: Normal.      Adnexa: Right adnexa normal and left adnexa normal.     Rectum: Normal.     Comments: pH = 4 Pap and swabs collected Lymphadenopathy:     Head:     Right side of head: No preauricular or posterior auricular adenopathy.     Left side of head: No preauricular or posterior auricular adenopathy.     Cervical: No cervical adenopathy.     Upper Body:     Right upper body: No supraclavicular, axillary or epitrochlear adenopathy.     Left upper body: No supraclavicular, axillary or epitrochlear adenopathy.     Lower Body: No right inguinal adenopathy. No left inguinal adenopathy.  Skin:    General: Skin is warm and  dry.     Findings: No rash.  Neurological:     Mental Status: She is alert and oriented to person, place, and time.       Assessment and Plan:  Madison Hood is a 32 y.o. female [phone] presenting to the [facility] Department for an yearly wellness and contraception visit   Contraception counseling: Reviewed options based on patient desire and reproductive life plan. Patient is interested in keeping her Hormonal Implant. This was not provided to the patient today, it was inserted last year.  Risks, benefits, and typical effectiveness rates were reviewed.  Questions were answered.  Written information was also given to the patient to review.    The patient will follow up in  1 years for surveillance.  The patient was told to call with any further questions, or with any concerns about this method of contraception.  Emphasized use of condoms 100% of the time for STI prevention.  ECP not indicated   1. 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 Well woman exam Recommended pt see primary care for mole on left breast. CBE normal - Syphilis Serology, [facility] - HIV [place] [facility] - Chlamydia/Gonorrhea [place] [facility] - IGP, Aptima HPV  3. History of HPV infection  - IGP, Aptima HPV  4. Screening for venereal disease  - Syphilis Serology, [facility] - HIV [place] [facility] - Chlamydia/Gonorrhea [place] [facility] - Chlamydia/Gonorrhea [place] [facility] - WET PREP FOR TRICH, YEAST, CLUE    Return if symptoms worsen or fail to improve.  No future appointments.  Alicia Amel, NP

## 2023-11-18 NOTE — Progress Notes (Signed)
Patient is here for PE and FP visit. FP packet given to patient and contents reviewed. Wet prep results reviewed with pt, no treatment required per standing order. Condoms declined. Sonda Primes, RN.

## 2023-11-19 ENCOUNTER — Ambulatory Visit: Payer: Self-pay

## 2023-11-19 DIAGNOSIS — Z23 Encounter for immunization: Secondary | ICD-10-CM

## 2023-11-19 DIAGNOSIS — Z719 Counseling, unspecified: Secondary | ICD-10-CM

## 2023-11-19 NOTE — Progress Notes (Signed)
Pt returned to nurse clinic for Immigration vaccines. [employer] for [employer] B, MMR and Varicella were approved. Completed Merck application with lot #'s and date of admin faxed to [employer]. Given VIS, administered vaccines, tolerated well. Given NCIR copies, informed of the next vaccine date. Pt states she will re-apply for patient  assistance for 3rd dose of Hep B and she is aware of submitting another application. M.Greg Eckrich, LPN.

## 2023-11-23 LAB — IGP, APTIMA HPV
HPV Aptima: NEGATIVE
PAP Smear Comment: 0

## 2024-01-13 NOTE — Addendum Note (Signed)
Addended by: Heywood Bene on: 01/13/2024 11:18 AM   Modules accepted: Orders

## 2024-10-23 ENCOUNTER — Ambulatory Visit
Admission: EM | Admit: 2024-10-23 | Discharge: 2024-10-23 | Disposition: A | Discharge status: Home / Self Care | Payer: Self-pay | Attending: Emergency Medicine | Admitting: Emergency Medicine

## 2024-10-23 ENCOUNTER — Encounter: Payer: Self-pay | Admitting: Emergency Medicine

## 2024-10-23 DIAGNOSIS — R350 Frequency of micturition: Secondary | ICD-10-CM | POA: Insufficient documentation

## 2024-10-23 LAB — POCT URINE DIPSTICK
Bilirubin, UA: NEGATIVE
Blood, UA: NEGATIVE
Glucose, UA: NEGATIVE mg/dL
Ketones, POC UA: NEGATIVE mg/dL
Nitrite, UA: NEGATIVE
POC PROTEIN,UA: 30 — AB
Spec Grav, UA: 1.02 (ref 1.010–1.025)
Urobilinogen, UA: 1 U/dL
pH, UA: 7 (ref 5.0–8.0)

## 2024-10-23 MED ORDER — CEPHALEXIN 500 MG PO CAPS: 500.0000 mg | ORAL_CAPSULE | Freq: Three times a day (TID) | ORAL | 0 refills | Status: AC

## 2024-10-23 NOTE — ED Provider Notes (Incomplete)
 [facility]    CSN: [id number] Arrival date & time: 10/23/24  1534      History   Chief Complaint Chief Complaint  Patient presents with   Urinary Frequency   Dysuria    HPI Madison Hood Madison Hood is a 33 y.o. female.   Started back working out, injury from aflac incorporated to lower back on right side,   Pressure with uriantion, frequency, for 4 days, incontinence, '  Cranberry juice,   Currently mensturuating,   Past Medical History:  Diagnosis Date   Anemia    Depression    History of anemia    History of elective abortion    History of spontaneous abortion    Hypertension    Pregnancy induced hypertension     Patient Active Problem List   Diagnosis Date Noted   History of gestational hypertension 01/23/2022   Blurred vision 01/23/2022   Postpartum anxiety 01/23/2022   Constipation 08/22/2021   Abnormal Pap smear of cervix  09/06/20 neg HPV + 09/14/2020   Anxiety 09/06/2020   Major depressive disorder, single episode, mild 01/20/2017    Past Surgical History:  Procedure Laterality Date   denies surgical history     DILATION AND EVACUATION N/A 12/14/2021   Procedure: DILATATION AND EVACUATION;  Surgeon: Eldonna Suzen Octave, MD;  Location: [facility] LD ORS;  Service: Gynecology;  Laterality: N/A;    OB History     Gravida  4   Para  2   Term  2   Preterm  0   AB  2   Living  2      SAB  2   IAB  0   Ectopic  0   Multiple  0   Live Births  2            Home Medications    Prior to Admission medications   Medication Sig Start Date End Date Taking? Authorizing Provider  acetaminophen  (TYLENOL ) 500 MG tablet Take 500 mg by mouth every 6 (six) hours as needed. Patient not taking: Reported on 11/18/2023    [provider]  ibuprofen  (ADVIL ) 600 MG tablet Take 1 tablet (600 mg total) by mouth every 6 (six) hours. Patient not taking: Reported on 11/18/2023 12/16/21   Eveline Lynwood MATSU, MD  metoCLOPramide  (REGLAN ) 10 MG tablet  Take 1 tablet (10 mg total) by mouth 3 (three) times daily with meals. Patient not taking: Reported on 01/23/2022 12/21/21 12/21/22  Nugent, Nat BRAVO, NP  Multiple Vitamin (MULTIVITAMIN) tablet Take 1 tablet by mouth daily.    [provider]  NIFEdipine  (ADALAT  CC) 30 MG 24 hr tablet Take 1 tablet (30 mg total) by mouth daily. Patient not taking: Reported on 11/18/2023 12/16/21   Eveline Lynwood MATSU, MD  Prenatal Vit-Fe Fumarate-FA (PRENATAL MULTIVITAMIN) TABS tablet Take 1 tablet by mouth daily at 12 noon. Patient not taking: Reported on 02/27/2022    [provider]    Family History Family History  Problem Relation Age of Onset   Heart disease Mother    Hyperlipidemia Mother    Hypertension Mother    Allergies Mother    Hypertension Father    Hyperlipidemia Father    Migraines Sister    Migraines Sister    Cancer Maternal Grandmother     Social History Social History   Tobacco Use   Smoking status: Never   Smokeless tobacco: Never  Vaping Use   Vaping status: Never Used  Substance Use Topics   Alcohol  use: Not Currently    Alcohol/week: 1.0 - 5.0 standard drink of alcohol    Types: 1 Glasses of wine per week    Comment: last used a month ago   Drug use: Yes    Types: Marijuana     Allergies   Patient has no known allergies.   Review of Systems Review of Systems  Genitourinary:  Positive for dysuria and frequency.     Physical Exam Triage Vital Signs ED Triage Vitals  Encounter Vitals Group     BP 10/23/24 1609 103/67     Girls Systolic BP Percentile --      Girls Diastolic BP Percentile --      Boys Systolic BP Percentile --      Boys Diastolic BP Percentile --      Pulse Rate 10/23/24 1609 75     Resp 10/23/24 1609 18     Temp 10/23/24 1609 98.2 F (36.8 C)     Temp Source 10/23/24 1609 Oral     SpO2 10/23/24 1609 98 %     Weight --      Height --      Head Circumference --      Peak Flow --      Pain Score 10/23/24 1611 0     Pain  Loc --      Pain Education --      Exclude from Growth Chart --    No data found.  Updated Vital Signs BP 103/67 (BP Location: Left Arm)   Pulse 75   Temp 98.2 F (36.8 C) (Oral)   Resp 18   LMP 10/19/2024 (Exact Date)   SpO2 98%   Breastfeeding Yes   Visual Acuity Right Eye Distance:   Left Eye Distance:   Bilateral Distance:    Right Eye Near:   Left Eye Near:    Bilateral Near:     Physical Exam   UC Treatments / Results  Labs (all labs ordered are listed, but only abnormal results are displayed) Labs Reviewed  POCT URINE DIPSTICK - Abnormal; Notable for the following components:      Result Value   POC PROTEIN,UA =30 (*)    Leukocytes, UA Small (1+) (*)    All other components within normal limits  URINE CULTURE    EKG   Radiology No results found.  Procedures Procedures (including critical care time)  Medications Ordered in UC Medications - No data to display  Initial Impression / Assessment and Plan / UC Course  I have reviewed the triage vital signs and the nursing notes.  Pertinent labs & imaging results that were available during my care of the patient were reviewed by me and considered in my medical decision making (see chart for details).     *** Final Clinical Impressions(s) / UC Diagnoses   Final diagnoses:  Urinary frequency     Discharge Instructions      Your urinalysis shows Madison Hood blood cells and nitrates which are indicative of infection, your urine will be sent to the lab to determine exactly which bacteria is present, if any changes need to be made to your medications you will be notified  Begin use of   You may use over-the-counter Azo to help minimize your symptoms until antibiotic removes bacteria, this medication will turn your urine orange  Increase your fluid intake through use of water  As always practice good hygiene, wiping front to back and avoidance of scented vaginal products to prevent  further  irritation  If symptoms continue to persist after use of medication or recur please follow-up with urgent care or your primary doctor as needed    ED Prescriptions   None    PDMP not reviewed this encounter.

## 2024-10-23 NOTE — Discharge Instructions (Addendum)
 Your urinalysis shows Madison Hood blood cells does not show bacteria, your urine will be sent to the lab to determine exactly which bacteria is present, if any changes need to be made to your medications you will be notified  Begin use of begin cephalexin  every 8 hours for 5 days, this is the antibiotic and you were started on it because you are symptomatic  It is okay for you to take Azo but because your baby is still latching take doses in the morning primarily  If you are looking for a prebiotic probiotic you may try things such as Ollie pop, poppy and similar drinks, apple cider vinegar drinks, and prebiotic probiotic vitamins can be used for routine and daily use, prebiotic and probiotics introduced natural healthy bacteria into the body which can help with gut and vaginal health  You may use over-the-counter Azo to help minimize your symptoms until antibiotic removes bacteria, this medication will turn your urine orange  Increase your fluid intake through use of water  As always practice good hygiene, wiping front to back and avoidance of scented vaginal products to prevent further irritation  If symptoms continue to persist after use of medication or recur please follow-up with urgent care or your primary doctor as needed

## 2024-10-23 NOTE — ED Triage Notes (Signed)
 Patient reports urinary frequency and dysuria x 4 days. Patient has not taken anything for symptoms.

## 2024-10-25 ENCOUNTER — Ambulatory Visit (HOSPITAL_COMMUNITY): Payer: Self-pay

## 2024-10-25 LAB — URINE CULTURE: Culture: >=100000 — AB
